# Patient Record
Sex: Male | Born: 1960
Health system: Southern US, Community
[De-identification: ages and names within clinical notes are randomized; demographics above are authoritative.]

## PROBLEM LIST (undated history)

## (undated) DIAGNOSIS — M2550 Pain in unspecified joint: Secondary | ICD-10-CM

## (undated) DIAGNOSIS — C801 Malignant (primary) neoplasm, unspecified: Secondary | ICD-10-CM

## (undated) DIAGNOSIS — N200 Calculus of kidney: Secondary | ICD-10-CM

## (undated) DIAGNOSIS — E079 Disorder of thyroid, unspecified: Secondary | ICD-10-CM

## (undated) DIAGNOSIS — M722 Plantar fascial fibromatosis: Secondary | ICD-10-CM

## (undated) DIAGNOSIS — D649 Anemia, unspecified: Secondary | ICD-10-CM

## (undated) HISTORY — DX: Anemia, unspecified: D64.9

## (undated) HISTORY — DX: Disorder of thyroid, unspecified: E07.9

## (undated) HISTORY — DX: Calculus of kidney: N20.0

## (undated) HISTORY — PX: HERNIA REPAIR: SHX51

## (undated) HISTORY — PX: LITHOTRIPSY: SUR834

## (undated) HISTORY — DX: Pain in unspecified joint: M25.50

## (undated) HISTORY — DX: Malignant (primary) neoplasm, unspecified: C80.1

## (undated) HISTORY — PX: COLONOSCOPY: SHX174

## (undated) HISTORY — DX: Plantar fascial fibromatosis: M72.2

---

## 2007-07-15 ENCOUNTER — Ambulatory Visit: Payer: Self-pay | Admitting: Sports Medicine

## 2007-07-15 DIAGNOSIS — M7750 Other enthesopathy of unspecified foot: Secondary | ICD-10-CM | POA: Insufficient documentation

## 2007-07-15 DIAGNOSIS — M549 Dorsalgia, unspecified: Secondary | ICD-10-CM | POA: Insufficient documentation

## 2007-11-01 ENCOUNTER — Ambulatory Visit: Payer: Self-pay | Admitting: Sports Medicine

## 2008-10-18 ENCOUNTER — Ambulatory Visit: Payer: Self-pay | Admitting: Sports Medicine

## 2008-10-18 DIAGNOSIS — M79609 Pain in unspecified limb: Secondary | ICD-10-CM

## 2011-06-06 DIAGNOSIS — N200 Calculus of kidney: Secondary | ICD-10-CM | POA: Insufficient documentation

## 2012-05-16 ENCOUNTER — Ambulatory Visit (INDEPENDENT_AMBULATORY_CARE_PROVIDER_SITE_OTHER): Payer: 59 | Admitting: Sports Medicine

## 2012-05-16 ENCOUNTER — Encounter: Payer: Self-pay | Admitting: Sports Medicine

## 2012-05-16 VITALS — BP 135/84 | HR 60 | Ht 71.0 in | Wt 185.0 lb

## 2012-05-16 DIAGNOSIS — D649 Anemia, unspecified: Secondary | ICD-10-CM

## 2012-05-16 DIAGNOSIS — M722 Plantar fascial fibromatosis: Secondary | ICD-10-CM

## 2012-05-16 NOTE — Assessment & Plan Note (Signed)
I suspect this relates to his leg cramping on left and his poorer biking performance Start on Ferrous sulfate  Follow up with Dr Nicholaus Bloom to be sure of etiology

## 2012-05-16 NOTE — Progress Notes (Signed)
  Subjective:    Patient ID: Benjamin Zimmerman, male    DOB: 1960-10-21, 51 y.o.   MRN: [id number]  HPI  Pt presents to clinic for f/u of heel pain L > R that he has had intermittently x 22 yrs. Also has left calf cramping and tightness worse in the mornings.  Does not notice swelling recently. Has tried Hapad insoles, and custom orthotics.  Increased standing causes pain. Rides bike most mornings - which helps loosen calf tightness and heel pain.  Firm shoes with narrow heel are most comfortable.   Has developed anemia - hgb down to a level of 12.3. Has donated blood regularly for many years. Feels more fatigued while biking - not able to work hills as hard    Review of Systems     Objective:   Physical Exam  NAD AT and foot structure normal bilat Nodule on midportion of lt ant calcaneus- feels like soft tissue at insertion of PF Arch mildly flattened- not significantly pronated Posterior tib functions well  Moderate TTP left PF insertion RT PF insertion is non tender Walking gati normal  Ultrasound Left PF is 0.75 with thickening in the proximal 2 to 3 cms of fascia Small spur noted Some hypoechoic change  Rt PF is 0.38 and appears normal Old spur appears to have separated from RT PF     Assessment & Plan:

## 2012-05-16 NOTE — Assessment & Plan Note (Addendum)
Note scan is worse than in 2010  Begin scaphoid pads Use arch straps PF stretches and exercises Icing Recheck in 8 wks  Add scaphoid pads to bike shoes

## 2012-05-16 NOTE — Patient Instructions (Addendum)
Try scaphoid pad in shoes when possible  Try ankle sleeve  Ice baths are very helpful for plantar fasciitis  Please start ferrous sulfate 325 mg once daily  Please follow up in 2 months  Thank you for seeing [place] today!

## 2012-06-03 ENCOUNTER — Encounter: Payer: Self-pay | Admitting: *Deleted

## 2012-07-19 ENCOUNTER — Ambulatory Visit (INDEPENDENT_AMBULATORY_CARE_PROVIDER_SITE_OTHER): Payer: 59 | Admitting: Sports Medicine

## 2012-07-19 ENCOUNTER — Encounter: Payer: Self-pay | Admitting: Sports Medicine

## 2012-07-19 VITALS — BP 128/87 | HR 69 | Ht 71.0 in | Wt 185.0 lb

## 2012-07-19 DIAGNOSIS — M722 Plantar fascial fibromatosis: Secondary | ICD-10-CM

## 2012-07-19 DIAGNOSIS — M79609 Pain in unspecified limb: Secondary | ICD-10-CM

## 2012-07-19 DIAGNOSIS — D649 Anemia, unspecified: Secondary | ICD-10-CM

## 2012-07-19 MED ORDER — AMITRIPTYLINE HCL 10 MG PO TABS: ORAL_TABLET | ORAL | Status: DC

## 2012-07-19 NOTE — Assessment & Plan Note (Signed)
This is improving with the series of stretches and exercises  He continues to use scaphoid pads and some padding to protect his feet  These have been helpful  I wanted him to continue the standard stretches and exercises for his plantar fasciitis for 2 more months and we can reevaluate

## 2012-07-19 NOTE — Progress Notes (Signed)
Patient ID: Benjamin Zimmerman, male   DOB: [birth year], 52 y.o.   MRN: [id number]  Patient returns for followup of plantar fasciitis in his left heel This was confirmed by ultrasound He does have a small spur He also had some changes over the metatarsal area  This has improved so he is having less pain with biking and activity  A second problem is cramping in the left foot and calf This affects the lateral left calf A body helix compression sleeve has been helpful  We found he had anemia and started him on iron He also notes some improvement in his calf cramps since starting the iron Last hemoglobin had improved to about 13 His colonoscopy and other workup was negative   Physical examination  Palpation of the left foot reveals no tenderness over the insertion to the plantar fascia There is a small nodule near the os calcis it feels like scar tissue He has good great toe motion He has a moderate arch  Examination of the calf does not reveal any tenderness There is no sign of discoloration or swelling

## 2012-07-19 NOTE — Assessment & Plan Note (Signed)
I am not sure if the cramps in his feet that go up into his left calf her triggered by some nerve irritation but I am suspicious of this  We will give him Toprol of amitriptyline 10-20 mg at nighttime  See if this helps after try that for one to 2 months

## 2012-07-19 NOTE — Assessment & Plan Note (Signed)
Keep up the iron at least once daily  We'll need to see if this improves his lower extremity symptoms which would suggest that his problem was more like restless leg

## 2014-06-11 ENCOUNTER — Other Ambulatory Visit: Payer: Self-pay | Admitting: Family Medicine

## 2014-06-11 DIAGNOSIS — E010 Iodine-deficiency related diffuse (endemic) goiter: Secondary | ICD-10-CM

## 2014-06-13 ENCOUNTER — Ambulatory Visit
Admission: RE | Admit: 2014-06-13 | Discharge: 2014-06-13 | Disposition: A | Discharge status: Home / Self Care | Payer: Managed Care, Other (non HMO) | Source: Ambulatory Visit | Attending: Family Medicine | Admitting: Family Medicine

## 2014-06-13 DIAGNOSIS — E010 Iodine-deficiency related diffuse (endemic) goiter: Secondary | ICD-10-CM

## 2014-06-19 ENCOUNTER — Other Ambulatory Visit: Payer: Self-pay | Admitting: Family Medicine

## 2014-06-19 DIAGNOSIS — E041 Nontoxic single thyroid nodule: Secondary | ICD-10-CM

## 2014-06-21 ENCOUNTER — Ambulatory Visit
Admission: RE | Admit: 2014-06-21 | Discharge: 2014-06-21 | Disposition: A | Discharge status: Home / Self Care | Payer: Managed Care, Other (non HMO) | Source: Ambulatory Visit | Attending: Family Medicine | Admitting: Family Medicine

## 2014-06-21 ENCOUNTER — Other Ambulatory Visit (HOSPITAL_COMMUNITY)
Admission: RE | Admit: 2014-06-21 | Discharge: 2014-06-21 | Disposition: A | Discharge status: Home / Self Care | Payer: Managed Care, Other (non HMO) | Source: Ambulatory Visit | Attending: Interventional Radiology | Admitting: Interventional Radiology

## 2014-06-21 DIAGNOSIS — E041 Nontoxic single thyroid nodule: Secondary | ICD-10-CM | POA: Insufficient documentation

## 2014-08-15 DIAGNOSIS — E890 Postprocedural hypothyroidism: Secondary | ICD-10-CM | POA: Insufficient documentation

## 2014-08-15 DIAGNOSIS — C73 Malignant neoplasm of thyroid gland: Secondary | ICD-10-CM | POA: Insufficient documentation

## 2015-02-12 DIAGNOSIS — H5210 Myopia, unspecified eye: Secondary | ICD-10-CM | POA: Insufficient documentation

## 2015-02-12 DIAGNOSIS — H43399 Other vitreous opacities, unspecified eye: Secondary | ICD-10-CM | POA: Insufficient documentation

## 2015-02-12 DIAGNOSIS — E079 Disorder of thyroid, unspecified: Secondary | ICD-10-CM | POA: Insufficient documentation

## 2015-04-09 ENCOUNTER — Encounter: Payer: Self-pay | Admitting: Family Medicine

## 2015-04-09 ENCOUNTER — Ambulatory Visit (INDEPENDENT_AMBULATORY_CARE_PROVIDER_SITE_OTHER): Payer: Managed Care, Other (non HMO) | Admitting: Family Medicine

## 2015-04-09 ENCOUNTER — Encounter (INDEPENDENT_AMBULATORY_CARE_PROVIDER_SITE_OTHER): Payer: Self-pay

## 2015-04-09 VITALS — BP 134/93 | HR 71 | Ht 70.0 in | Wt 185.0 lb

## 2015-04-09 DIAGNOSIS — M5010 Cervical disc disorder with radiculopathy, unspecified cervical region: Secondary | ICD-10-CM | POA: Diagnosis not present

## 2015-04-09 MED ORDER — PREDNISONE 10 MG PO TABS: ORAL_TABLET | ORAL | Status: DC

## 2015-04-09 NOTE — Patient Instructions (Signed)
You have cervical radiculopathy (a pinched nerve in the neck). Prednisone 6 day dose pack to relieve irritation/inflammation of the nerve. Consider cervical collar as often as possible to rest the nerve. Do home exercises daily. Consider physical therapy for stretching, exercises, traction, and modalities in the future - call me if you want to do this. Heat 15 minutes at a time 3-4 times a day to help with spasms. Watch head position when on computers, texting, when sleeping in bed - should in line with back to prevent further nerve traction and irritation. Consider home traction unit. If not improving we will consider an MRI. Follow up with me in 1 month to 6 weeks.

## 2015-04-10 DIAGNOSIS — M5010 Cervical disc disorder with radiculopathy, unspecified cervical region: Secondary | ICD-10-CM | POA: Insufficient documentation

## 2015-04-10 NOTE — Assessment & Plan Note (Signed)
mild, only with decreased sensation and reflexes.  Start with prednisone dose pack, cervical collar.  Continue with his home exercise program.  Heat as needed.  Ergonomic issues discussed.  F/u in 1 month to 6 weeks.  Consider MRI, physical therapy if not improving.

## 2015-04-10 NOTE — Progress Notes (Signed)
PCP: Nilda Simmer, MD  Subjective:   HPI: Patient is a 54 y.o. male here for left hand numbness.  Patient reports for 1 month he's had numbness in left middle, index fingers and thumb. No tingling. Gets these symptoms with motions of his neck. Pain level 0/10. Remotely had problems with neck, did home exercises and this resolved - backed off these though. No skin changes, fever, other complaints.  No past medical history on file.  Current Outpatient Prescriptions on File Prior to Visit  Medication Sig Dispense Refill  . amitriptyline (ELAVIL) 10 MG tablet 1 or 2 tablets daily at bedtime 60 tablet 2  . cyclobenzaprine (FLEXERIL) 10 MG tablet 0.5-1 by mouth at bedtime as needed for muscle spasm.      No current facility-administered medications on file prior to visit.    No past surgical history on file.  Allergies  Allergen Reactions  . Keflex [Cephalexin]     Social History   Social History  . Marital Status: Married    Spouse Name: N/A  . Number of Children: N/A  . Years of Education: N/A   Occupational History  . Not on file.   Social History Main Topics  . Smoking status: Never Smoker   . Smokeless tobacco: Not on file  . Alcohol Use: Not on file  . Drug Use: Not on file  . Sexual Activity: Not on file   Other Topics Concern  . Not on file   Social History Narrative    No family history on file.  BP 134/93 mmHg  Pulse 71  Ht 5\' 10"  (1.778 m)  Wt 185 lb (83.915 kg)  BMI 26.54 kg/m2  Review of Systems: See HPI above.    Objective:  Physical Exam:  Gen: NAD  Neck: No gross deformity, swelling, bruising. No TTP .  No midline/bony TTP. FROM neck - no pain. BUE strength 5/5.   Sensation intact to light touch.   Trace right triceps, brachioradialis MSRs.  2+ equal reflexes in bilateral biceps, left triceps, brachioradialis tendons. Negative spurlings.  Left hand: FROM all digits without pain. Negative tinels, phalens. Mild decreased  sensation index finger currently.    Assessment & Plan:  1. Cervical radiculopathy - mild, only with decreased sensation and reflexes.  Start with prednisone dose pack, cervical collar.  Continue with his home exercise program.  Heat as needed.  Ergonomic issues discussed.  F/u in 1 month to 6 weeks.  Consider MRI, physical therapy if not improving.

## 2015-04-12 ENCOUNTER — Telehealth: Payer: Self-pay | Admitting: Family Medicine

## 2015-04-12 NOTE — Telephone Encounter (Signed)
You could provide him with the cervical strain handout (same exercises for this as for a pinched nerve in the neck).  Other option would be physical therapy if he wanted to do this.

## 2015-04-12 NOTE — Telephone Encounter (Signed)
Spoke to patient. Sending exercise handout.

## 2015-09-10 ENCOUNTER — Encounter: Payer: Self-pay | Admitting: Sports Medicine

## 2015-09-10 ENCOUNTER — Ambulatory Visit (INDEPENDENT_AMBULATORY_CARE_PROVIDER_SITE_OTHER): Payer: Managed Care, Other (non HMO) | Admitting: Sports Medicine

## 2015-09-10 ENCOUNTER — Ambulatory Visit (INDEPENDENT_AMBULATORY_CARE_PROVIDER_SITE_OTHER): Payer: Managed Care, Other (non HMO)

## 2015-09-10 VITALS — BP 125/83 | HR 70 | Resp 18 | Wt 193.8 lb

## 2015-09-10 DIAGNOSIS — M5010 Cervical disc disorder with radiculopathy, unspecified cervical region: Secondary | ICD-10-CM

## 2015-09-10 DIAGNOSIS — M722 Plantar fascial fibromatosis: Secondary | ICD-10-CM | POA: Diagnosis not present

## 2015-09-10 DIAGNOSIS — M545 Low back pain: Secondary | ICD-10-CM

## 2015-09-10 DIAGNOSIS — M5416 Radiculopathy, lumbar region: Secondary | ICD-10-CM

## 2015-09-10 DIAGNOSIS — M2560 Stiffness of unspecified joint, not elsewhere classified: Secondary | ICD-10-CM | POA: Diagnosis not present

## 2015-09-10 DIAGNOSIS — M792 Neuralgia and neuritis, unspecified: Secondary | ICD-10-CM | POA: Insufficient documentation

## 2015-09-10 MED ORDER — MELOXICAM 15 MG PO TABS: ORAL_TABLET | ORAL | Status: DC

## 2015-09-10 MED ORDER — PREDNISONE 50 MG PO TABS: ORAL_TABLET | ORAL | Status: DC

## 2015-09-10 NOTE — Assessment & Plan Note (Signed)
Checking standard rheumatoid workup

## 2015-09-10 NOTE — Assessment & Plan Note (Signed)
Bilateral left worse than right plantar fasciitis. His failed several steroid injections, physical therapy. He will return for custom orthotics, if insufficient response we will proceed with PRP injection.

## 2015-09-10 NOTE — Assessment & Plan Note (Signed)
Classic left C6 and C7 radiculopathy. Aggressive formal physical therapy, prednisone burst for 5 days, meloxicam, baseline x-rays of the C-spine. If insufficient response in one month we will proceed with MRI for interventional planning.

## 2015-09-10 NOTE — Assessment & Plan Note (Signed)
Left L5 radiculopathy. As above, prednisone, physical therapy, baseline x-rays, meloxicam. MRI for interventional planning if no better.

## 2015-09-10 NOTE — Progress Notes (Signed)
   Subjective:    I'm seeing this patient as a consultation for:  Dr. Kathyrn Lass  CC: Multiple issues  HPI: Plantar fasciitis: Left-sided, but bilateral, he's failed several steroid injections, physical therapy, he has only had rigid custom orthotics, has never had cushioned orthotics. Has never done PRP.  Left lumbar radiculitis: Radiation to the foot in an L5 distribution, has not yet had any treatment.  Left cervical radiculopathy: Left C6 and C7, moderate, persistent.  Past medical history, Surgical history, Family history not pertinant except as noted below, Social history, Allergies, and medications have been entered into the medical record, reviewed, and no changes needed.   Review of Systems: No headache, visual changes, nausea, vomiting, diarrhea, constipation, dizziness, abdominal pain, skin rash, fevers, chills, night sweats, weight loss, swollen lymph nodes, body aches, joint swelling, muscle aches, chest pain, shortness of breath, mood changes, visual or auditory hallucinations.   Objective:   General: Well Developed, well nourished, and in no acute distress.  Neuro/Psych: Alert and oriented x3, extra-ocular muscles intact, able to move all 4 extremities, sensation grossly intact. Skin: Warm and dry, no rashes noted.  Respiratory: Not using accessory muscles, speaking in full sentences, trachea midline.  Cardiovascular: Pulses palpable, no extremity edema. Abdomen: Does not appear distended. Neck: Negative spurling's Full neck range of motion Grip strength and sensation normal in bilateral hands Strength good C4 to T1 distribution No sensory change to C4 to T1 Reflexes normal Back Exam:  Inspection: Unremarkable  Motion: Flexion 45 deg, Extension 45 deg, Side Bending to 45 deg bilaterally,  Rotation to 45 deg bilaterally  SLR laying: Negative  XSLR laying: Negative  Palpable tenderness: None. FABER: negative. Sensory change: Gross sensation intact to all lumbar  and sacral dermatomes.  Reflexes: 2+ at both patellar tendons, 2+ at achilles tendons, Babinski's downgoing.  Strength at foot  Plantar-flexion: 5/5 Dorsi-flexion: 5/5 Eversion: 5/5 Inversion: 5/5  Leg strength  Quad: 5/5 Hamstring: 5/5 Hip flexor: 5/5 Hip abductors: 5/5  Gait unremarkable.  Impression and Recommendations:   This case required medical decision making of moderate complexity.

## 2015-09-27 ENCOUNTER — Ambulatory Visit (INDEPENDENT_AMBULATORY_CARE_PROVIDER_SITE_OTHER): Payer: Managed Care, Other (non HMO) | Admitting: Sports Medicine

## 2015-09-27 ENCOUNTER — Encounter: Payer: Self-pay | Admitting: Sports Medicine

## 2015-09-27 VITALS — BP 116/71 | HR 64 | Resp 18 | Wt 192.3 lb

## 2015-09-27 DIAGNOSIS — M609 Myositis, unspecified: Secondary | ICD-10-CM

## 2015-09-27 DIAGNOSIS — IMO0001 Reserved for inherently not codable concepts without codable children: Secondary | ICD-10-CM

## 2015-09-27 DIAGNOSIS — M791 Myalgia: Secondary | ICD-10-CM

## 2015-09-27 DIAGNOSIS — M722 Plantar fascial fibromatosis: Secondary | ICD-10-CM | POA: Diagnosis not present

## 2015-09-27 MED ORDER — CYCLOBENZAPRINE HCL 10 MG PO TABS: ORAL_TABLET | ORAL | Status: DC

## 2015-09-27 NOTE — Progress Notes (Signed)

## 2015-09-27 NOTE — Assessment & Plan Note (Signed)
Continued bilateral left worse than right plantar fasciitis,  Failed several steroid injections, custom orthotics today, if insufficient response we will proceed with PRP.

## 2015-09-28 LAB — COMPREHENSIVE METABOLIC PANEL
ALT: 20 U/L (ref 9–46)
Alkaline Phosphatase: 69 U/L (ref 40–115)
BUN: 18 mg/dL (ref 7–25)
CO2: 23 mmol/L (ref 20–31)
Calcium: 9.4 mg/dL (ref 8.6–10.3)
Glucose, Bld: 89 mg/dL (ref 65–99)
Potassium: 4.4 mmol/L (ref 3.5–5.3)

## 2015-09-28 LAB — COMPREHENSIVE METABOLIC PANEL WITH GFR
AST: 20 U/L (ref 10–35)
Albumin: 4.4 g/dL (ref 3.6–5.1)
Chloride: 103 mmol/L (ref 98–110)
Creat: 0.91 mg/dL (ref 0.70–1.33)
Sodium: 139 mmol/L (ref 135–146)
Total Bilirubin: 0.6 mg/dL (ref 0.2–1.2)
Total Protein: 6.7 g/dL (ref 6.1–8.1)

## 2015-09-28 LAB — CBC
HCT: 46.6 % (ref 38.5–50.0)
Hemoglobin: 15.1 g/dL (ref 13.2–17.1)
MCH: 28.9 pg (ref 27.0–33.0)
MCHC: 32.4 g/dL (ref 32.0–36.0)
MCV: 89.1 fL (ref 80.0–100.0)
MPV: 10.1 fL (ref 7.5–12.5)
Platelets: 310 10*3/uL (ref 140–400)
RBC: 5.23 MIL/uL (ref 4.20–5.80)
RDW: 13.2 % (ref 11.0–15.0)
WBC: 4.2 10*3/uL (ref 3.8–10.8)

## 2015-09-28 LAB — CK: Total CK: 77 U/L (ref 7–232)

## 2015-09-28 LAB — URIC ACID: Uric Acid, Serum: 5 mg/dL (ref 4.0–7.8)

## 2015-09-28 LAB — SEDIMENTATION RATE: Sed Rate: 1 mm/hr (ref 0–20)

## 2015-09-28 LAB — RHEUMATOID FACTOR: Rheumatoid fact SerPl-aCnc: <10 [IU]/mL (ref <=14)

## 2015-09-30 LAB — ANA: Anti Nuclear Antibody(ANA): NEGATIVE

## 2015-09-30 LAB — CYCLIC CITRUL PEPTIDE ANTIBODY, IGG: Cyclic Citrullin Peptide Ab: <16 Units

## 2016-02-27 ENCOUNTER — Ambulatory Visit (INDEPENDENT_AMBULATORY_CARE_PROVIDER_SITE_OTHER): Payer: Managed Care, Other (non HMO) | Admitting: Sports Medicine

## 2016-02-27 ENCOUNTER — Encounter: Payer: Self-pay | Admitting: Sports Medicine

## 2016-02-27 DIAGNOSIS — F418 Other specified anxiety disorders: Secondary | ICD-10-CM

## 2016-02-27 DIAGNOSIS — M5416 Radiculopathy, lumbar region: Secondary | ICD-10-CM | POA: Diagnosis not present

## 2016-02-27 DIAGNOSIS — M5010 Cervical disc disorder with radiculopathy, unspecified cervical region: Secondary | ICD-10-CM | POA: Diagnosis not present

## 2016-02-27 DIAGNOSIS — F329 Major depressive disorder, single episode, unspecified: Secondary | ICD-10-CM | POA: Insufficient documentation

## 2016-02-27 DIAGNOSIS — F32A Depression, unspecified: Secondary | ICD-10-CM | POA: Insufficient documentation

## 2016-02-27 DIAGNOSIS — F419 Anxiety disorder, unspecified: Secondary | ICD-10-CM

## 2016-02-27 NOTE — Assessment & Plan Note (Addendum)
Patient needs to touch base with his PCP regarding this. His pain will never be fully controlled unless his depression and anxiety is under control. He really has mild symptoms, sleep seems to be the major issue.

## 2016-02-27 NOTE — Assessment & Plan Note (Signed)
Left lumbar radiculopathy, patient is somewhat hesitant to accept this diagnosis, referral to [facility].

## 2016-02-27 NOTE — Progress Notes (Signed)
  Subjective:    CC: Follow-up  HPI: Left cervical radiculopathy: Desires referral to the spine Center at [facility] for further evaluation.  Left lumbar radiculopathy: Is not entirely convinced that the cramping running down his leg can be coming from his back, does have a bit of back pain. X-rays showed multilevel degenerative changes, he does desire that we refer him to [facility] for further evaluation and treatment.  Mood disorder: Noted difficulty sleeping, some Restoril and felt better. Overall endorses mild fatigue, and mild nervousness, difficulty controlling his worry, worrying about different things, and trouble relaxing. No suicidal or homicidal ideation.  Past medical history:  Negative.  See flowsheet/record as well for more information.  Surgical history: Negative.  See flowsheet/record as well for more information.  Family history: Negative.  See flowsheet/record as well for more information.  Social history: Negative.  See flowsheet/record as well for more information.  Allergies, and medications have been entered into the medical record, reviewed, and no changes needed.   Review of Systems: No fevers, chills, night sweats, weight loss, chest pain, or shortness of breath.   Objective:    General: Well Developed, well nourished, and in no acute distress.  Neuro: Alert and oriented x3, extra-ocular muscles intact, sensation grossly intact.  HEENT: Normocephalic, atraumatic, pupils equal round reactive to light, neck supple, no masses, no lymphadenopathy, thyroid nonpalpable.  Skin: Warm and dry, no rashes. Cardiac: Regular rate and rhythm, no murmurs rubs or gallops, no lower extremity edema.  Respiratory: Clear to auscultation bilaterally. Not using accessory muscles, speaking in full sentences.  Impression and Recommendations:    Cervical disc disorder with radiculopathy of cervical region Referral to [facility] per patient request. He has left cervical radiculopathy with  evidence of degenerative disc disease at multiple levels on x-ray, he ultimately is going to need an MRI and likely epidurals.  Left lumbar radiculopathy Left lumbar radiculopathy, patient is somewhat hesitant to accept this diagnosis, referral to [facility].  Anxiety and depression Patient needs to touch base with his PCP regarding this. His pain will never be fully controlled unless his depression and anxiety is under control.

## 2016-02-27 NOTE — Assessment & Plan Note (Signed)
Referral to [facility] per patient request. He has left cervical radiculopathy with evidence of degenerative disc disease at multiple levels on x-ray, he ultimately is going to need an MRI and likely epidurals.

## 2016-04-03 ENCOUNTER — Other Ambulatory Visit: Payer: Self-pay | Admitting: Sports Medicine

## 2016-04-03 DIAGNOSIS — M5010 Cervical disc disorder with radiculopathy, unspecified cervical region: Secondary | ICD-10-CM

## 2016-04-07 ENCOUNTER — Other Ambulatory Visit: Payer: Self-pay | Admitting: Neurosurgery

## 2016-04-07 DIAGNOSIS — M5416 Radiculopathy, lumbar region: Secondary | ICD-10-CM

## 2016-04-11 ENCOUNTER — Ambulatory Visit (HOSPITAL_BASED_OUTPATIENT_CLINIC_OR_DEPARTMENT_OTHER): Admission: RE | Admit: 2016-04-11 | Payer: Managed Care, Other (non HMO) | Source: Ambulatory Visit

## 2016-04-30 ENCOUNTER — Ambulatory Visit (HOSPITAL_BASED_OUTPATIENT_CLINIC_OR_DEPARTMENT_OTHER)
Admission: RE | Admit: 2016-04-30 | Discharge: 2016-04-30 | Disposition: A | Discharge status: Home / Self Care | Payer: Managed Care, Other (non HMO) | Source: Ambulatory Visit | Attending: Neurosurgery | Admitting: Neurosurgery

## 2016-04-30 DIAGNOSIS — M5116 Intervertebral disc disorders with radiculopathy, lumbar region: Secondary | ICD-10-CM | POA: Insufficient documentation

## 2016-04-30 DIAGNOSIS — Q057 Lumbar spina bifida without hydrocephalus: Secondary | ICD-10-CM | POA: Diagnosis not present

## 2016-04-30 DIAGNOSIS — M5416 Radiculopathy, lumbar region: Secondary | ICD-10-CM | POA: Diagnosis present

## 2016-11-19 ENCOUNTER — Encounter: Payer: Self-pay | Admitting: Sports Medicine

## 2016-11-19 ENCOUNTER — Ambulatory Visit (INDEPENDENT_AMBULATORY_CARE_PROVIDER_SITE_OTHER): Payer: Managed Care, Other (non HMO) | Admitting: Sports Medicine

## 2016-11-19 DIAGNOSIS — G5792 Unspecified mononeuropathy of left lower limb: Secondary | ICD-10-CM

## 2016-11-19 DIAGNOSIS — M722 Plantar fascial fibromatosis: Secondary | ICD-10-CM | POA: Diagnosis not present

## 2016-11-19 DIAGNOSIS — M792 Neuralgia and neuritis, unspecified: Secondary | ICD-10-CM

## 2016-11-19 MED ORDER — GABAPENTIN 300 MG PO CAPS: 300.0000 mg | ORAL_CAPSULE | Freq: Every day | ORAL | 2 refills | Status: DC

## 2016-11-19 NOTE — Assessment & Plan Note (Signed)
While this does not appear to be lumbar mediated neuropathic pain I believe the pattern is likely nerve compression. Since evaluations are so unrevealing I suspect this is a branch of sural nerve  Trial on 300 gabapentin x 1 mo and keep sx diary Keep up stretches and normal exercise pattern Titrate up if needed for gabapentin if we are sure he is getting some response (note did not like side-effects of amitriptyline in 2013)

## 2016-11-19 NOTE — Progress Notes (Signed)
CC: left leg and foot pain  Referred: courtesy of Dr Jacelyn Grip  HPI - I saw patient in 2013 for plantar fasciitis that improved with stretches and exercises. However, shortly after that more cramping of feet and toes Some perioidic cramping in the left leg These sxs worsen at night Running will make them worse  Pain is not classic for Pf but sometimes hurts first thing in morning  Had full evaluation to see if this was lumbar spine MRI is unremarkable  Past HX Cervical radiculopathy 2016 Dr Barbaraann Barthel Thyroid cancer 2016 Orthotics by Dr Burman Foster  2017  ROS No sciatica No cramping in the RT lower ext Calf tightness primarily Left but sometimes RT  PE Thin W M in NAD BP 120/70   Ht 5\' 10"  (1.778 m)   Wt 165 lb (74.8 kg)   BMI 23.68 kg/m   SLR is neg. Palpation of left leg from buttocks to knee reveals no TTP Also no pressure related tinels to sciatic pressure Palpation of calf reveals a couple of areas of very mild TTP No bruising No swelling  Left plantar fascia is only mildly TTP Good great toe motion No TTP over forefoot  Scanning Ultrasound Left lower Extremity The entire calf visualized No sign of any tears Some varicosities deep in soleus and in upper lateral gastroc No swelling noted  Plantar fascia is mildly enlarged at 0.63 cm but no rea swelling  Forefoot shows no neuroma Mild bony irregularity at plantar MT region at sessamoids and at MT 3 head  Impression:  Nothing remarkable to explain symptoms on ultrasound of left lower extremity. Mild thickening of left plantar fascia  Ultrasound and interpretation by Benjamin Zimmerman. Benjamin Alar, MD

## 2016-11-19 NOTE — Assessment & Plan Note (Signed)
Still some thickening but I doubt this is cause of sxs  Keep up orthotics  Keep up stretches

## 2016-12-22 ENCOUNTER — Ambulatory Visit (INDEPENDENT_AMBULATORY_CARE_PROVIDER_SITE_OTHER): Payer: Managed Care, Other (non HMO) | Admitting: Sports Medicine

## 2016-12-22 ENCOUNTER — Encounter: Payer: Self-pay | Admitting: Sports Medicine

## 2016-12-22 DIAGNOSIS — M792 Neuralgia and neuritis, unspecified: Secondary | ICD-10-CM

## 2016-12-22 DIAGNOSIS — G5792 Unspecified mononeuropathy of left lower limb: Secondary | ICD-10-CM

## 2016-12-22 MED ORDER — GABAPENTIN 300 MG PO CAPS: ORAL_CAPSULE | ORAL | 2 refills | Status: DC

## 2016-12-22 NOTE — Assessment & Plan Note (Signed)
Pain still seems neuropathic to me I don't think the dose of gabapentin had enough effiect  Increase to therapeutic dose  300 bid and 600 qhs See if higher dose will lessen sxs  Reck 1 mo

## 2016-12-22 NOTE — Progress Notes (Signed)
CC: Chronic left foot pain and cramping  Evaluated by DR T for probably radiculopathy Lumbar MRI showed some possible irritation but not clearly enough to explain sxs On last visit I went over exam No clear source of nerve entrapment However exam and history still most consistent with nerve mediated pain  Trial gabapentin 300 HS for 1 month Did not make him drowsy and sleep pattern still irregular Not much reduction in nocturnal cramps Note he is still on synrthroid 0.125 and wonders if this is keeping him awake  ROS No first moprning heel pain No numbness on foot No clear change with activity  PE WD WM in NAD BP 120/70   Ht 5\' 10"  (1.778 m)   Wt 165 lb (74.8 kg)   BMI 23.68 kg/m   No TTP at medial heel Neg Tinel at TT and lateral foot No MT TTP No swelling  SLR neg Back extension causes no real change in sxs

## 2016-12-22 NOTE — Patient Instructions (Signed)
I still think Nerve irritation is the likely cause I don't think gabapentin was strong enough for you  Days 1 to 5 gabapentin 600 at night Days 6 to 10 -- 300 in morning and 600 at night Days 11 to 15 -- 300 morning/ 300 noon/ 600 night  Stay on that  See me in 1 month  Neuropathic cramping/ radiculopathy

## 2017-01-21 ENCOUNTER — Ambulatory Visit (INDEPENDENT_AMBULATORY_CARE_PROVIDER_SITE_OTHER): Payer: Managed Care, Other (non HMO) | Admitting: Sports Medicine

## 2017-01-21 VITALS — BP 110/70 | Ht 70.0 in | Wt 165.0 lb

## 2017-01-21 DIAGNOSIS — G5792 Unspecified mononeuropathy of left lower limb: Secondary | ICD-10-CM | POA: Diagnosis not present

## 2017-01-21 DIAGNOSIS — M792 Neuralgia and neuritis, unspecified: Secondary | ICD-10-CM

## 2017-01-21 MED ORDER — GABAPENTIN 300 MG PO CAPS: ORAL_CAPSULE | ORAL | 3 refills | Status: DC

## 2017-01-21 NOTE — Progress Notes (Signed)
   Subjective:    Patient ID: Benjamin Zimmerman, male    DOB: 1960/06/09, 56 y.o.   MRN: [id number]  HPI 56 yo Man presenting for follow-up L foot pain and plantar fasciitis. He states the plantar fasciitis is unchanged. His L foot pain is worse today, however he did have some improvement on increased Gabapentin dose recently (300mg  - 300mg  - 600mg ) which resulted In 3-4 days over the past week without significant discomfort. He describes vague cramping discomfort in the 2-3rd toes and dorsum of foot. He denies activity changes, new injury, or worsening symptoms. His discomfort is improved with compression stalkings and tightly fitting heel cup in his shoes. No other palliative measures are endorsed.   Note - able to bike most days with no increase in pain  Review of Systems  Constitutional: Negative for chills and fever.  Respiratory: Negative for chest tightness and shortness of breath.   Cardiovascular: Negative for chest pain and leg swelling.  Musculoskeletal: Negative for gait problem and joint swelling.  Skin: Negative for pallor and rash.  Neurological: Negative for weakness and numbness.       Discomfort Dorsum of L Foot       Objective:   Physical Exam  Constitutional: He is oriented to person, place, and time. He appears well-developed and well-nourished. No distress.  HENT:  Head: Normocephalic and atraumatic.  Cardiovascular: Normal rate and intact distal pulses.   Pulmonary/Chest: Effort normal. No respiratory distress.  Musculoskeletal: Normal range of motion.  Lower Extremity: Symmetric bilateral hair loss distal lower extremity, no erythema or edema noted in feet Non-tender to palpation: Base of 5th MT b/l, Great Toe b/l, Med/Lat Malleoli b/l, Fibular Head b/l, 1-5th MT ROM: Plantar Flx, Dorsi Flx, Inversion, Eversion all nml Mm Strength: Inv/Eversion 5/5 b/l, EHL 5/5 b/l Neurovascularly intact in the distal lower extremity b/l Special Testing: Calc Squeeze neg b/l,  Talar Tilt neg b/l, Thompson neg b/l Hip:  Full ROM b/l Hip Scour neg b/l No tenderness to palpation laterally (Greater Troch)  Neurological: He is alert and oriented to person, place, and time.  Skin: Skin is warm. He is not diaphoretic. No erythema.  Psychiatric: He has a normal mood and affect.       Assessment & Plan:  Chronic Left Foot Pain and Cramping Left Plantar Fasciitis  Given improvement with Gabapentin feel his pain is likely Neuropathic in nature. We will plan to increase dose and titrate up over the next 3 weeks. Will progress week 1 (600mg  - 300 mg - 600 mg) week 2-3 (600mg  - 600mg  - 600mg ) week 4-6 (600mg  - 600mg  - 900mg ) and see him back in 6 weeks for follow up. If pain persists on this dose consider additional imaging to investigate possible compression, however today don't feel this is indicated given lack of new new findings on exam today and no definitive exam findings exhibiting compression neuropathy. Continue supportive measures for Left Plantar Fasciitis.

## 2017-01-21 NOTE — Assessment & Plan Note (Signed)
I believe we are seeing some response from higher doses of gabapentin  With this in mind we want to gradually titrate upward  Stop at dose of 600/ 600/ 900  Reck in about 6 weeks

## 2017-01-21 NOTE — Patient Instructions (Addendum)
Gabapentin schedule until our next visit: Week 1: 600 mg am, 300 mg lunch, 600 mg evening Week 2-3: 600 mg am, 600 mg lunch, 600 mg evening Week 4-6: 600 mg am, 600 mg lunch, 900 mg evening  Follow up in 6 weeks.

## 2017-03-04 ENCOUNTER — Ambulatory Visit: Payer: Managed Care, Other (non HMO) | Admitting: Sports Medicine

## 2017-03-04 ENCOUNTER — Ambulatory Visit (INDEPENDENT_AMBULATORY_CARE_PROVIDER_SITE_OTHER): Payer: Managed Care, Other (non HMO) | Admitting: Sports Medicine

## 2017-03-04 ENCOUNTER — Encounter: Payer: Self-pay | Admitting: Sports Medicine

## 2017-03-04 DIAGNOSIS — G5792 Unspecified mononeuropathy of left lower limb: Secondary | ICD-10-CM | POA: Diagnosis not present

## 2017-03-04 DIAGNOSIS — M792 Neuralgia and neuritis, unspecified: Secondary | ICD-10-CM

## 2017-03-04 MED ORDER — METHYLPREDNISOLONE ACETATE 40 MG/ML IJ SUSP
40.0000 mg | Freq: Once | INTRAMUSCULAR | Status: AC
  Administered 2017-03-04: 40 mg via INTRA_ARTICULAR

## 2017-03-04 NOTE — Progress Notes (Signed)
Neuropathic pain to left foot  Continues to have tightness and cramps into foot at night sometimes happens in day Maybe some relief with gabapentin but not much change since last visit Getting sharp tinel like pain with pressure around fibular head Massage to peroneals brought this out  Dec. 2017 MRI showed some nerve impingement likely at L3 area but not clear evidence For this being the source  ROS No weakness in left leg No numbness to foot No PF sxs today  PE WDWNWM BP 110/60   Ht 5\' 10"  (1.778 m)   Wt 165 lb (74.8 kg)   BMI 23.68 kg/m   Ankle: No visible erythema or swelling. Range of motion is full in all directions. Strength is 5/5 in all directions. Stable lateral and medial ligaments; squeeze test and kleiger test unremarkable; Talar dome nontender; No pain at base of 5th MT; No tenderness over cuboid; No tenderness over N spot or navicular prominence No tenderness on posterior aspects of lateral and medial malleolus No sign of peroneal tendon subluxations; Negative tarsal tunnel tinel's Able to walk 4 steps.  Foot - no TTP or swelling  Fibular head Feels tingling with pressure behind this  Ultrasound: common peroneal nerve  Behind fibular head left common peroneal nerve visualized These is a thick fascia above and surrounding nerve Nerve itself appears normal  Impression - possible fascial entrapment of common peroneal nerve  Ultrasound and interpretation by Wolfgang Phoenix. [place] Alar, MD

## 2017-03-04 NOTE — Assessment & Plan Note (Signed)
On [place] he has thick fascia around common peroneal n  Trial with hydrodissection  Procedure:  Injection of left common peroneal nerve sheath Consent obtained and verified. Time-out conducted. Noted no overlying erythema, induration, or other signs of local infection. Skin prepped in a sterile fashion. Topical analgesic spray: Ethyl chloride. Completed without difficulty.  We localized the proximal common peroneal nerve.  I inserted needle to edge of nerve and hydrodissected this area using a volume of 3 ccs. Meds: 1 cc slumedrol 40 and 2 cc lidocaine 1% Pain immediately improved suggesting accurate placement of the medication. Advised to call if fevers/chills, erythema, induration, drainage, or persistent bleeding.  We will give this 6 weeks to see if it helps  Switch gabapentin just to HS since that is his time of most sxs 900 to 1200  Reck 6 wks

## 2017-04-20 ENCOUNTER — Ambulatory Visit (INDEPENDENT_AMBULATORY_CARE_PROVIDER_SITE_OTHER): Payer: Managed Care, Other (non HMO) | Admitting: Sports Medicine

## 2017-04-20 ENCOUNTER — Encounter: Payer: Self-pay | Admitting: Sports Medicine

## 2017-04-20 DIAGNOSIS — M67442 Ganglion, left hand: Secondary | ICD-10-CM

## 2017-04-20 DIAGNOSIS — G5792 Unspecified mononeuropathy of left lower limb: Secondary | ICD-10-CM

## 2017-04-20 DIAGNOSIS — M792 Neuralgia and neuritis, unspecified: Secondary | ICD-10-CM

## 2017-04-20 MED ORDER — CLONAZEPAM 0.5 MG PO TABS: 0.5000 mg | ORAL_TABLET | Freq: Two times a day (BID) | ORAL | 2 refills | Status: DC

## 2017-04-20 NOTE — Assessment & Plan Note (Signed)
We will follow  If painful - [place] and drain

## 2017-04-20 NOTE — Patient Instructions (Signed)
Vitamin B6 50 mg or 100 mg tablets. 1 daily for right foot numbness. Remember some knee to chest stretches for low back Ankle balance exercises Will follow the cyst on left 3rd finger

## 2017-04-20 NOTE — Progress Notes (Signed)
Left foot pain - neuropathic  Injection of common peroneal nerve area with hydrodissection helped lower overall pain Gabapentin no real difference and stopped after 2 mos. Less severe pain in left foot now Cramps are less common Still primarily bothers him at night and still will wake him up at times  Cyst on left 3rd proximal phalanx of hand Not really painful but feels this  Some numbness RT heel/ no injury  ROS No numbness in left foot No swelling in foot Some periodic LB stiffness No true sciatica  PE Pleasant M in NAD BP 110/70   Ht 5\' 10"  (1.778 m)   Wt 165 lb (74.8 kg)   BMI 23.68 kg/m   Ankle: Left No visible erythema or swelling. Range of motion is full in all directions. Strength is 5/5 in all directions. Stable lateral and medial ligaments; squeeze test and kleiger test unremarkable; Talar dome nontender; No pain at base of 5th MT; No tenderness over cuboid; No tenderness over N spot or navicular prominence No tenderness on posterior aspects of lateral and medial malleolus No sign of peroneal tendon subluxations; Negative tarsal tunnel tinel's Able to walk 4 steps.  RT ankle foot exam normal Mild decrease in light sensation at RT peroneal area of heel  Moveable soft cystic structure flexor tendon of 3rd orox phalanx of left hand

## 2017-04-20 NOTE — Assessment & Plan Note (Signed)
We will try klonpin at night  Poor rest pattern and cramping may be helfpurl 0,5 mg one or two  1 foot balance/ std and reach exercises

## 2017-08-25 IMAGING — DX DG LUMBAR SPINE COMPLETE 4+V
5 series · 5 of 5 positions shown · non-contrast
Comparison: None.

CLINICAL DATA: Left side muscle spasms in stiffness for years. No
known injury.

EXAM:
LUMBAR SPINE - COMPLETE 4+ VIEW

[l-spine ap]
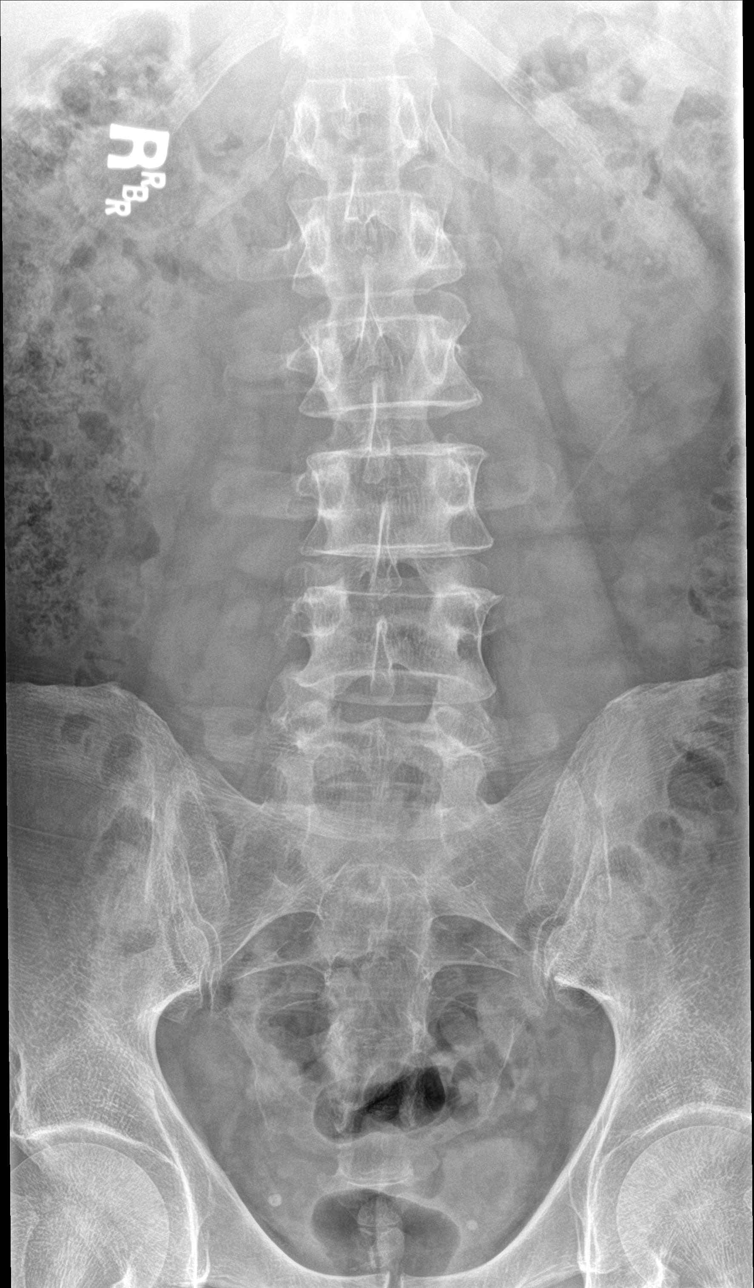

[l-spine obl (1 of 2)]
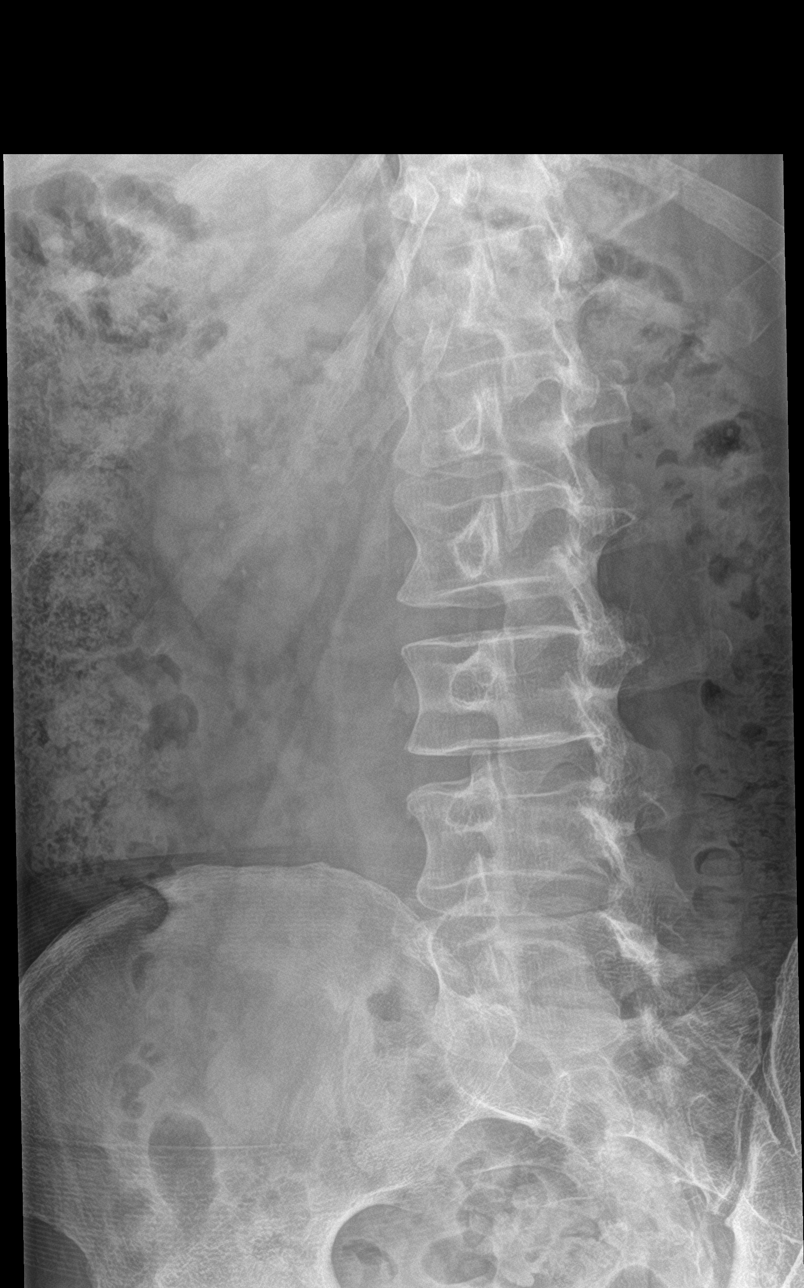

[l-spine obl (2 of 2)]
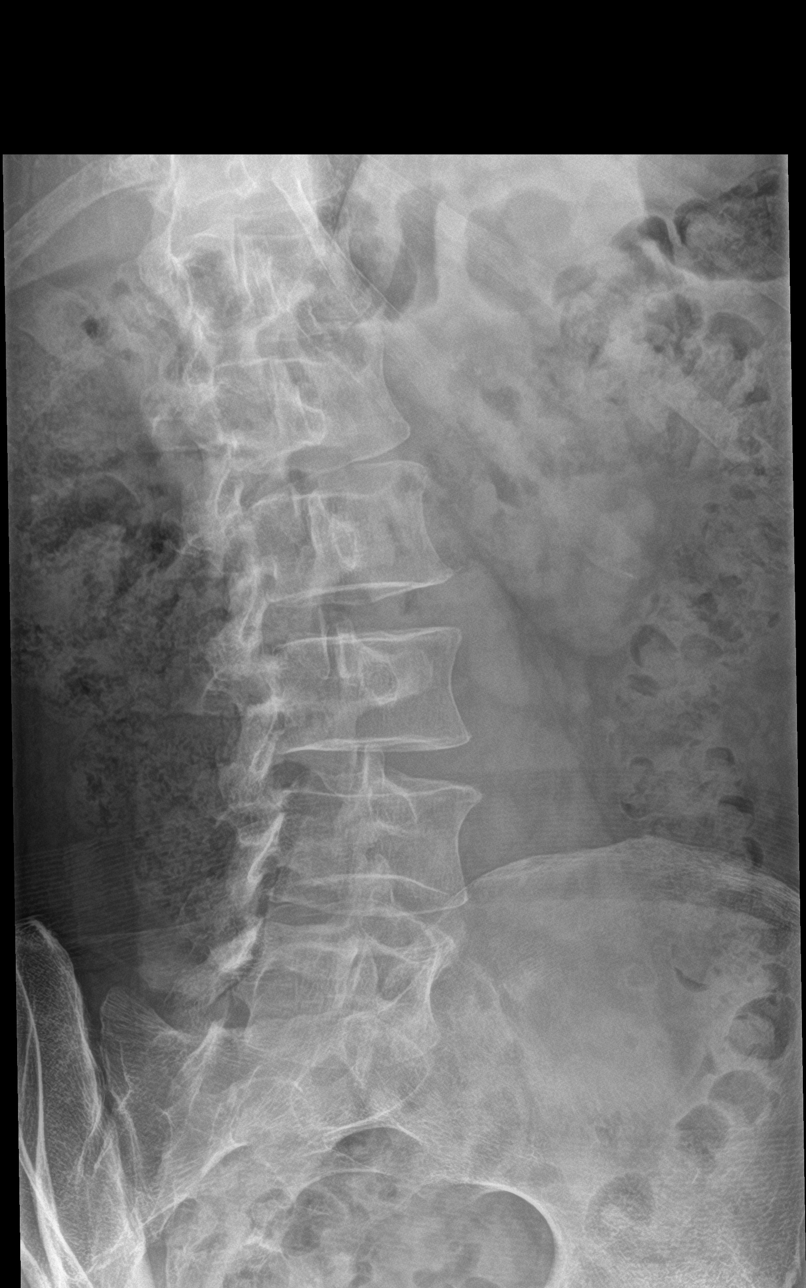

[l-spine lat]
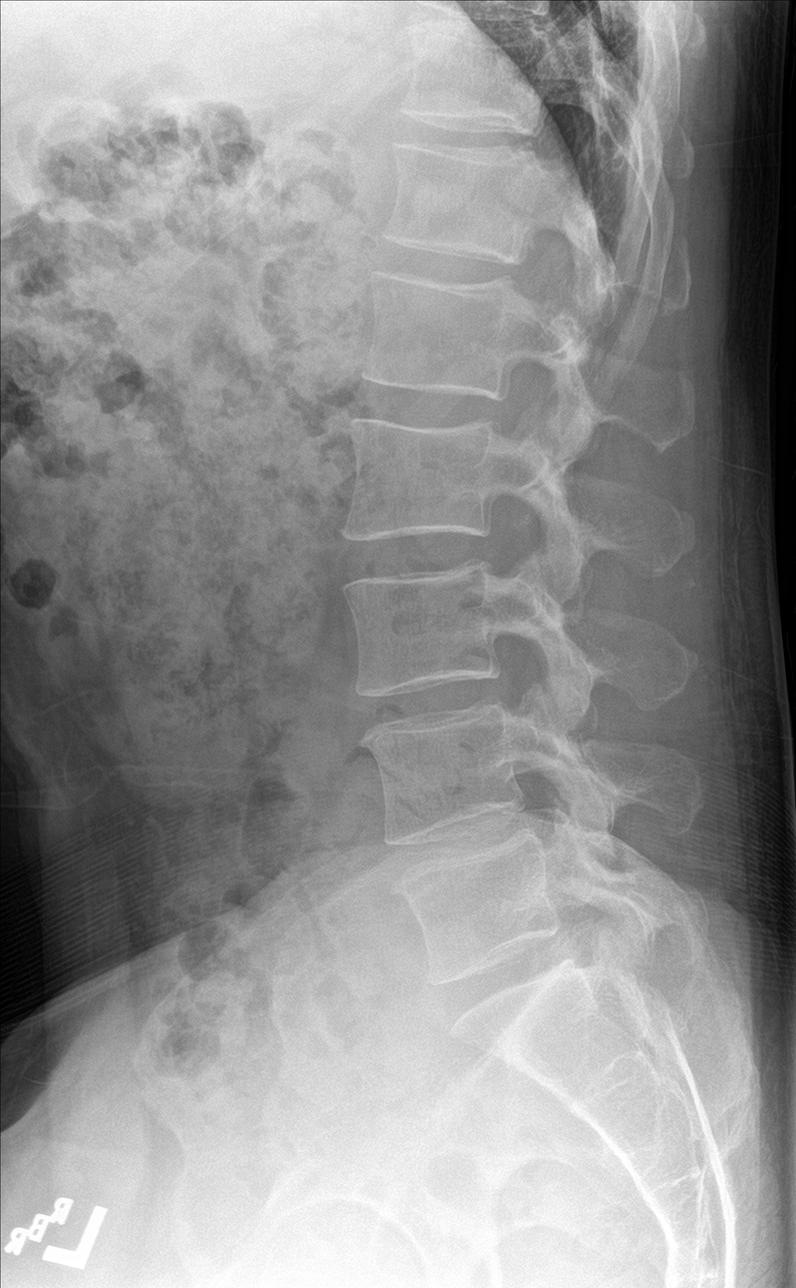

[l-spine spot]
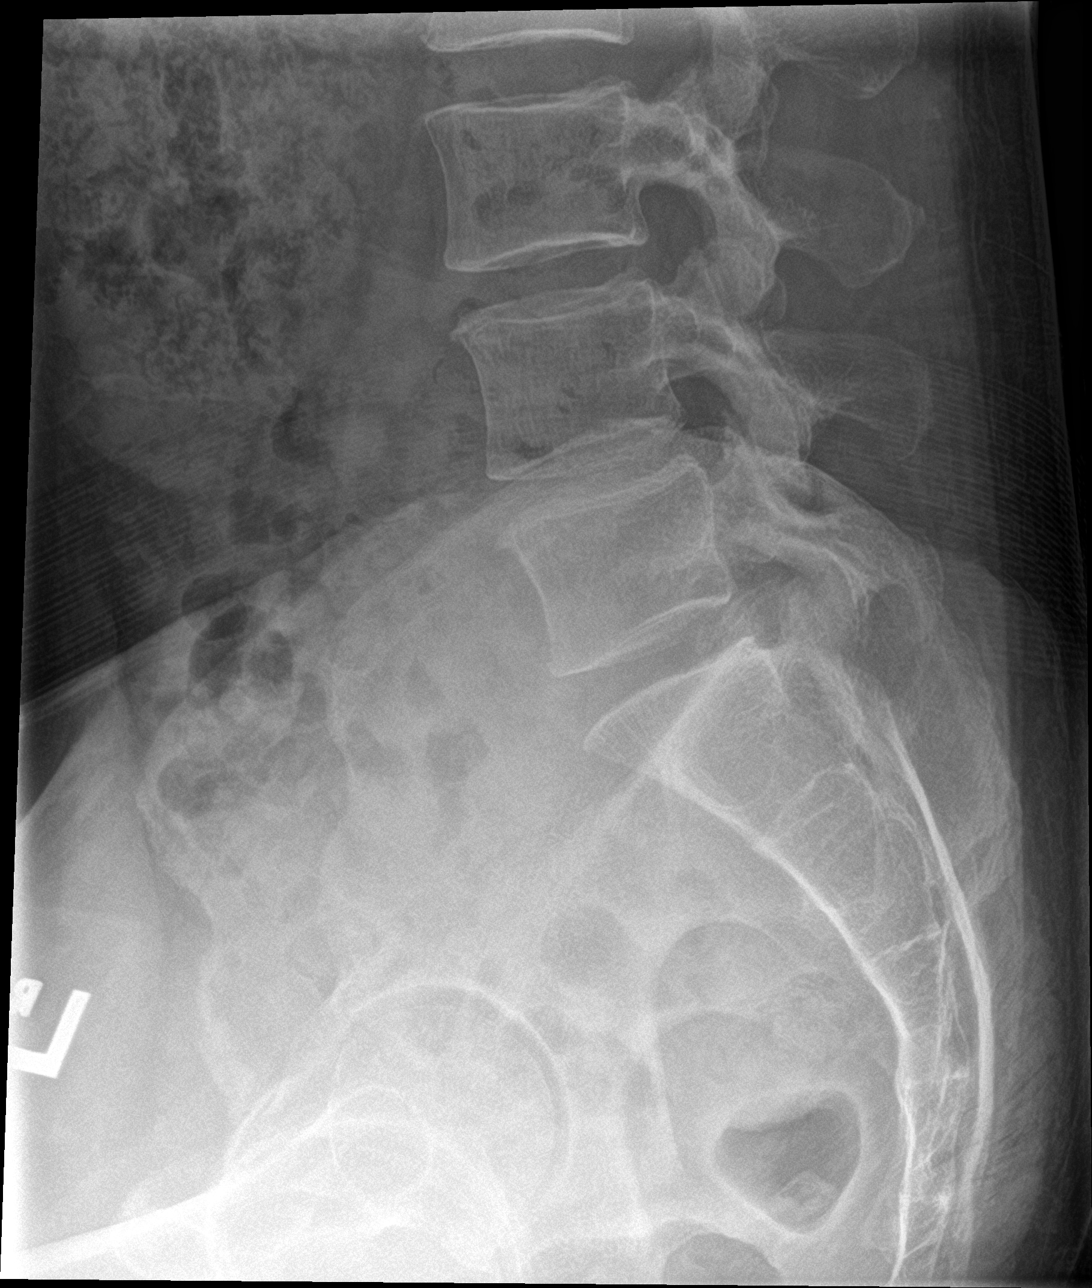

[5 of 5 positions shown; findings below may reference images not displayed]

FINDINGS: There is no evidence of lumbar spine fracture. Alignment is normal.
Intervertebral disc spaces are maintained.
IMPRESSION: Negative.

## 2017-09-14 ENCOUNTER — Encounter: Payer: Self-pay | Admitting: Sports Medicine

## 2017-09-14 ENCOUNTER — Ambulatory Visit (INDEPENDENT_AMBULATORY_CARE_PROVIDER_SITE_OTHER): Payer: Managed Care, Other (non HMO) | Admitting: Sports Medicine

## 2017-09-14 VITALS — BP 120/80 | Ht 70.0 in | Wt 165.0 lb

## 2017-09-14 DIAGNOSIS — M792 Neuralgia and neuritis, unspecified: Secondary | ICD-10-CM | POA: Diagnosis not present

## 2017-09-14 DIAGNOSIS — G2581 Restless legs syndrome: Secondary | ICD-10-CM | POA: Diagnosis not present

## 2017-09-14 MED ORDER — CLONAZEPAM 0.5 MG PO TABS: 0.5000 mg | ORAL_TABLET | Freq: Two times a day (BID) | ORAL | 2 refills | Status: DC

## 2017-09-14 NOTE — Progress Notes (Signed)
Chief complaint: Follow-up of chronic left foot pain, with suspicion of peroneal nerve irritation; now with left dorsal midfoot pain 2 months  History of present illness: Benjamin Zimmerman is a 57 year old male presents to sports medicine office today for follow-up of chronic left foot pain.  Does have history of neuropathic left foot pain, secondary to common peroneal nerve irritation.  Back in October 2018, he did have hydrodissection of the peroneal nerve with improvement in his symptoms.    He describes having cramps in his foot, pointing to the second, third, and fourth metatarsals as the epicenter as to where he feels the cramps.  He reports that this is mainly bothersome at nighttime.  At last appointment, he was prescribed Klonopin 0.5 mg to take nightly.  He reports that this is helped, mainly with getting him to sleep.  Fortunately, he does not report of having any cramps that wake him up from sleep at nighttime.  He reports over the last few months noticing pain more so in the anterior lateral ankle near the sinus tarsi region.  Does not report of any swelling, redness, warmth, or ecchymosis.  He is an avid biker, but has not been biking as frequently due to weather.  He does have custom orthotics made for him, he has a different pair of orthotics today and his stress shoes.  He reports that is about 69-8 months old.  He does not report of any numbness, tingling, burning paresthesias.  Reports while sitting down at times he will feel irritation in anterolateral left ankle will have to massage.    Of note, he reports that he does often donate blood, as frequently as every 8 weeks.  He does report in the past he has been told that he has been anemic and has been on iron supplementation.  He has been using vitamin B supplementation, is not sure if it is vitamin B6.  Review of systems:  As stated above  Interval past medical history, surgical history, family history, and social history obtained and unchanged.  His past medical history is notable for hypothyroidism, thyroid cancer, nephrolithiasis, anxiety, and depression; surgical history notable for thyroidectomy; does not report of any current tobacco use; no family history of diabetes; allergies and medications are reviewed and are reflected in EMR.  Physical exam: Vital signs are reviewed and are documented in the chart Gen.: Alert, oriented, appears stated age, in no apparent distress HEENT: Moist oral mucosa Respiratory: Normal respirations, able to speak in full sentences Cardiac: Regular rate, distal pulses 2+ Integumentary: No rashes on visible skin:  Neurologic: He actually does have intact strength with left ankle dorsiflexion, plantarflexion, inversion, and eversion, would categorize strength as 5/5, sensation 2+ in bilateral lower extremities Psych: Normal affect, mood is described as good Musculoskeletal: Inspection of left foot reveals no obvious deformities or muscle atrophy, no warmth, erythema, ecchymosis, or effusion, he is slightly tender to deep palpation over the anterolateral left ankle region near the sinus tarsi, no puffiness or swelling noted in that area, no TTP over the peroneals, fibular head, medial, or lateral malleolus, he has full ROM and strength without any elicitation of pain, on foot evaluation it appears his 2nd toe does splay out, indicating his tendency to pronate, does have flattened transverse arch on both sides, does have slight laxity at the 4th TMT  Assessment and plan: 1. Peroneal neuropathic pain of left foot 2. Concern for iron deficiency anemia causing symptomatic restless leg syndrome  Plan: Discussed with Benjamin Zimmerman that  most likely this is all stemming back to the common peroneal nerve being irritated. Discussed biomechanical issue at play such as laxity of the 4th TMT and ankle pronation. Discussed will have him fitted for arch strap today. Do not see any evidence on exam that he has sinus tarsi syndrome.  Do have concerns with the frequency of his blood donations. He reports that at last donation he was told his hemoglobin was 11.8. Discussed concern of him developing iron deficiency anemia after blood donation and this subsequently causing symptomatic restless leg syndrome. He does have an appointment with his primary care physician in the next few weeks. Created letter to request that his primary care physician check a CBC and ferritin at next visit. Discussed to verify that his vitamin supplementation is vitamin B6 and to use at least 50 mg daily. Will refill klonopin 0.5 mg nightly as this is controlling his foot cramping and not waking him up from sleep. Will have him return in 3 months or sooner as needed.   Benjamin Zimmerman, M.D. [facility]  I observed and examined the patient with the [facility] fellow and agree with assessment and plan.  Note reviewed and modified by me. Benjamin Libel, MD

## 2017-09-14 NOTE — Patient Instructions (Signed)
Dr Jacelyn Grip Please check a CBC and Ferritin with his routine labs as he has been low on both of these in the past Could be an association with his leg cramping  Trial with arch strap  Be sure you have enough arch support in various shoes  Make sure vitamin b6 is at least 50 mg

## 2017-11-23 ENCOUNTER — Other Ambulatory Visit: Payer: Self-pay

## 2017-11-23 MED ORDER — CLONAZEPAM 0.5 MG PO TABS: 0.5000 mg | ORAL_TABLET | Freq: Two times a day (BID) | ORAL | 2 refills | Status: DC

## 2018-01-04 ENCOUNTER — Ambulatory Visit (INDEPENDENT_AMBULATORY_CARE_PROVIDER_SITE_OTHER): Payer: Managed Care, Other (non HMO) | Admitting: Sports Medicine

## 2018-01-04 ENCOUNTER — Encounter: Payer: Self-pay | Admitting: Sports Medicine

## 2018-01-04 VITALS — BP 112/72 | Ht 70.0 in | Wt 164.0 lb

## 2018-01-04 DIAGNOSIS — G2581 Restless legs syndrome: Secondary | ICD-10-CM | POA: Diagnosis not present

## 2018-01-04 DIAGNOSIS — M792 Neuralgia and neuritis, unspecified: Secondary | ICD-10-CM

## 2018-01-04 MED ORDER — FERROUS SULFATE 325 (65 FE) MG PO TBEC: 325.0000 mg | DELAYED_RELEASE_TABLET | Freq: Three times a day (TID) | ORAL | 1 refills | Status: AC

## 2018-01-04 NOTE — Progress Notes (Signed)
   HPI  CC: Left foot cramping  [name] is a 57 year old male with history of microcytic anemia,Hypothyroidism Anxiety and depression who presents today for left foot cramping follow-up.He was previously seen in April this year in this clinic.  At that and he was started on Klonopin for nighttime use.  He states this does not help his nighttime cramping.  He describes the feeling is not necessarily a painful cramp, but more of a stiffness and discomfort in his at all 3 toes on his left foot.  He states this pain sometimes comes up into the back of his leg.  He states has been doing this around for years.  He had a recent workup with his primary care, with a macrocytic anemia with a hemoglobin of 11.8.  His ferritin at that time was also 7.4 with an iron level 46.he states that since that time he has started over-the-counter iron supplementation.  He states he will occasionally take it once a day, and does not know how much iron is actually in a supplement.  He states his symptoms of been unchanged since starting it.  He denies any numbness and tingling in the foot since the last visit.  He denies any weakness in the foot.  See HPI and/or previous note for associated ROS.  Objective: BP 112/72   Ht 5\' 10"  (1.778 m)   Wt 164 lb (74.4 kg)   BMI 23.53 kg/m    Gen: right-Hand Dominant. NAD, well groomed, a/o x3, normal affect.  CV: Well-perfused. Warm.  Resp: Non-labored.  Neuro: Sensation intact throughout. No gross coordination deficits.  Gait: Nonpathologic posture, unremarkable stride without signs of limp or balance issues.  Right foot exam: No swelling, warmth, erythema noted.  No tenderness to palpation noted along the foot.full range of motion throughout all testing..  Strength 5 out of 5 throughout all testing.  He does have second toe splaying and a flattening of transverse arch.  Negative anterior drawer.  Assessment and plan:  Restless Leg syndrome, likely secondary to low iron  and ferritin.  We will start patient onWe will start patient on iron sulfate 325 mg 3 times a day at this time. We advised him this could cause constipation and recommend starting docusate as well for a stool softener. We will obtain CBC and ferritin level in 4 weeks.We also discussed with patient that he should stop giving blood until further evaluation of his iron deficiency has been worked up.  Continue with vitamin B6 Supplementation.  Return to clinic in 5 weeks for reevaluation.If he is not improved at this time, we can consider getting an EMG for further evaluation of the patient's neurogenic pain.  Benjamin Rife, MD [facility] Fellow 01/04/2018 2:26 PM   I observed and examined the patient with the [facility] Fellow and agree with assessment and plan.  Note reviewed and modified by me. Benjamin Libel, MD

## 2018-01-04 NOTE — Patient Instructions (Signed)
Get your CBC and Ferritin levels tested in 4 weeks. Call us the day of or the day before you would like to get the test done so we can give the lab a heads up. You will get this done at the [facility]. [place], [place] Follow up with [place] in 5 weeks.

## 2018-02-10 ENCOUNTER — Other Ambulatory Visit: Payer: Self-pay | Admitting: *Deleted

## 2018-02-10 DIAGNOSIS — D509 Iron deficiency anemia, unspecified: Secondary | ICD-10-CM

## 2018-02-11 ENCOUNTER — Other Ambulatory Visit: Payer: Self-pay

## 2018-02-11 DIAGNOSIS — D509 Iron deficiency anemia, unspecified: Secondary | ICD-10-CM

## 2018-02-12 LAB — CBC
HEMATOCRIT: 42.3 % (ref 37.5–51.0)
HEMOGLOBIN: 14.2 g/dL (ref 13.0–17.7)
MCH: 28.5 pg (ref 26.6–33.0)
MCHC: 33.6 g/dL (ref 31.5–35.7)
MCV: 85 fL (ref 79–97)
Platelets: 300 10*3/uL (ref 150–450)
RBC: 4.98 x10E6/uL (ref 4.14–5.80)
RDW: 15.3 % (ref 12.3–15.4)
WBC: 4 10*3/uL (ref 3.4–10.8)

## 2018-02-12 LAB — FERRITIN: FERRITIN: 43 ng/mL (ref 30–400)

## 2018-02-15 ENCOUNTER — Ambulatory Visit (INDEPENDENT_AMBULATORY_CARE_PROVIDER_SITE_OTHER): Payer: Managed Care, Other (non HMO) | Admitting: Sports Medicine

## 2018-02-15 ENCOUNTER — Encounter: Payer: Self-pay | Admitting: Sports Medicine

## 2018-02-15 VITALS — BP 102/62 | Ht 70.0 in | Wt 164.0 lb

## 2018-02-15 DIAGNOSIS — G2581 Restless legs syndrome: Secondary | ICD-10-CM

## 2018-02-15 NOTE — Progress Notes (Signed)
   HPI  CC: Left foot cramping  Benjamin Zimmerman is a 57 year old male who presents for follow-up of left foot cramping.  He is found to have iron deficiency anemia at his last visit.  We obtained a CBC and a ferritin level, after 4 weeks of placing him on ferrous sulfate 325 twice daily therapy.  His hemoglobin went from 11.8-14.2 on the repeat.  His ferritin level went from 7.4-43 on repeat.  He states that he continues to have the cramping into the first 3 toes on his left foot.  He denies any numbness and tingling to foot.  He denies any weakness in the foot.  He states he has been taking the iron as prescribed.  He is also taking stool softener and has no bowel issues.  He denies any injury to the foot.  He states he still is riding 100 miles a week on his bike.  See HPI and/or previous note for associated ROS.  Objective: BP 102/62   Ht 5\' 10"  (1.778 m)   Wt 164 lb (74.4 kg)   BMI 23.53 kg/m  Gen: NAD, well groomed, a/o x3, normal affect.  CV: Well-perfused. Warm.  Resp: Non-labored.  Neuro: Sensation intact throughout. No gross coordination deficits.  Gait: Nonpathologic posture, unremarkable stride without signs of limp or balance issues.  Foot exam: No erythema, warmth, swelling.  No tenderness palpation on exam.  Full range of motion in dorsiflexion, plantarflexion, inversion, eversion.  Strength 5 out of 5 throughout testing.  Negative anterior drawer.  Assessment and plan: Restless leg syndrome, secondary to iron deficiency anemia.  We discussed with Benjamin Zimmerman today that it will take several months to likely build up the stores of iron that he has been depleted on.  We will continue with ferrous sulfate 325 twice daily at this time.  Our hope is that as his iron stores increased, his restless leg will begin to improve.  We will follow him up in 6 to 8 weeks.  If at that time he has no improvement, or worsening of symptoms, we should consider getting an EMG for nerve conduction studies.  Benjamin Rife, MD [facility] Fellow 02/15/2018 12:29 PM

## 2018-06-21 ENCOUNTER — Other Ambulatory Visit: Payer: Self-pay | Admitting: Sports Medicine

## 2018-06-21 MED ORDER — CLONAZEPAM 0.5 MG PO TABS: 0.5000 mg | ORAL_TABLET | Freq: Two times a day (BID) | ORAL | 2 refills | Status: DC

## 2018-06-23 ENCOUNTER — Encounter: Payer: Self-pay | Admitting: Sports Medicine

## 2018-06-23 ENCOUNTER — Other Ambulatory Visit: Payer: Managed Care, Other (non HMO)

## 2018-06-23 ENCOUNTER — Ambulatory Visit (INDEPENDENT_AMBULATORY_CARE_PROVIDER_SITE_OTHER): Payer: Managed Care, Other (non HMO) | Admitting: Sports Medicine

## 2018-06-23 VITALS — BP 116/70 | Ht 70.0 in | Wt 165.0 lb

## 2018-06-23 DIAGNOSIS — M25562 Pain in left knee: Secondary | ICD-10-CM | POA: Diagnosis not present

## 2018-06-23 DIAGNOSIS — G609 Hereditary and idiopathic neuropathy, unspecified: Secondary | ICD-10-CM | POA: Diagnosis not present

## 2018-06-23 DIAGNOSIS — G2581 Restless legs syndrome: Secondary | ICD-10-CM | POA: Diagnosis not present

## 2018-06-23 NOTE — Patient Instructions (Addendum)
[facility] [address], [place], [place] [address] Phone: [phone] Tuesday March 24th at 130pm Arrival time is 1pm

## 2018-06-23 NOTE — Progress Notes (Signed)
  QUINTRELL BAZE - 58 y.o. male MRN [id number]  Date of birth: [birth year]    SUBJECTIVE:      Chief Complaint: Left leg pain  HPI:  58 year old male with long history of left leg pain returns today for follow-up.  It has been felt that he has symptoms consistent with restless leg syndrome possibly related to a iron deficiency anemia.  He has been taking iron supplementation regularly twice daily.  Overall he is not noticed any significant change in his symptoms.  In fact, recently he has noted some aching pain in his gluten lateral thigh of his left leg.  He did start using heat which she does find somewhat helpful.  He has not been continuing biking recently but this is mostly due to weather.  In the past he has been trialed on gabapentin which has not been helpful.  Overall, he continues to have cramping pain in the lateral foot with the addition of new aching pain in the lateral thigh and glute.  He denies any numbness or tingling.  He denies any weakness.  No skin changes   ROS:     See HPI  PERTINENT  PMH / PSH FH / / SH:  Past Medical, Surgical, Social, and Family History Reviewed & Updated in the EMR.  Pertinent findings include:  Microcytic anemia  OBJECTIVE: BP 116/70   Ht 5\' 10"  (1.778 m)   Wt 165 lb (74.8 kg)   BMI 23.68 kg/m   Physical Exam:  Vital signs are reviewed.  GEN: Alert and oriented, NAD Pulm: Breathing unlabored PSY: normal mood, congruent affect  MSK: Left leg: No obvious deformity or swelling full range of motion hip, knee, and ankle 5/5 strength with hip flexion and abduction.  5/5 strength with knee flexion extension.  5/5 strength with ankle dorsiflexion and plantarflexion Sensation intact in all dermatomes 2+ DTRs   ASSESSMENT & PLAN:  1.  Left leg pain-patient continues to have seemingly neuropathic pain in the left leg.  Unremarkable exam today -We will repeat CBC and ferritin -B12 -Magnesium level -Referral to neurology for further  evaluation -Follow-up as needed  I observed and examined the patient with the Dr. Okey Dupre, [facility] and agree with assessment and plan.  Note reviewed and modified by me. Ila Mcgill, MD

## 2018-06-24 LAB — MAGNESIUM: Magnesium: 2.3 mg/dL (ref 1.6–2.3)

## 2018-06-24 LAB — CBC
Hematocrit: 43.8 % (ref 37.5–51.0)
Hemoglobin: 14.8 g/dL (ref 13.0–17.7)
MCH: 29.8 pg (ref 26.6–33.0)
MCHC: 33.8 g/dL (ref 31.5–35.7)
MCV: 88 fL (ref 79–97)
PLATELETS: 282 10*3/uL (ref 150–450)
RBC: 4.96 x10E6/uL (ref 4.14–5.80)
RDW: 11.9 % (ref 11.6–15.4)
WBC: 4.7 10*3/uL (ref 3.4–10.8)

## 2018-06-24 LAB — VITAMIN B12: VITAMIN B 12: 540 pg/mL (ref 232–1245)

## 2018-06-24 LAB — FERRITIN: Ferritin: 91 ng/mL (ref 30–400)

## 2018-07-05 ENCOUNTER — Telehealth: Payer: Self-pay

## 2018-07-05 NOTE — Telephone Encounter (Signed)
Per Dr. Oneida Alar: Ferritin was 91 so he will cut his iron to 1 tab 3 times a week. CBC, B12 and Magnesium levels were all good. Pt understands.

## 2018-08-16 ENCOUNTER — Ambulatory Visit: Payer: Self-pay | Admitting: Neurology

## 2018-10-13 ENCOUNTER — Encounter: Payer: Self-pay | Admitting: Neurology

## 2018-10-13 ENCOUNTER — Ambulatory Visit (INDEPENDENT_AMBULATORY_CARE_PROVIDER_SITE_OTHER): Payer: Managed Care, Other (non HMO) | Admitting: Neurology

## 2018-10-13 ENCOUNTER — Other Ambulatory Visit: Payer: Self-pay

## 2018-10-13 VITALS — BP 118/63 | HR 62 | Temp 98.0°F | Ht 70.0 in | Wt 176.0 lb

## 2018-10-13 DIAGNOSIS — G5603 Carpal tunnel syndrome, bilateral upper limbs: Secondary | ICD-10-CM

## 2018-10-13 DIAGNOSIS — M7918 Myalgia, other site: Secondary | ICD-10-CM

## 2018-10-13 DIAGNOSIS — M79652 Pain in left thigh: Secondary | ICD-10-CM

## 2018-10-13 DIAGNOSIS — G5732 Lesion of lateral popliteal nerve, left lower limb: Secondary | ICD-10-CM

## 2018-10-13 DIAGNOSIS — S76912A Strain of unspecified muscles, fascia and tendons at thigh level, left thigh, initial encounter: Secondary | ICD-10-CM

## 2018-10-13 DIAGNOSIS — M722 Plantar fascial fibromatosis: Secondary | ICD-10-CM

## 2018-10-13 NOTE — Patient Instructions (Addendum)
Peroneal neuropathy - at knee? Consider EMG/NCS in the future Numbness in hands: likely CTS, +Mcphalen's maneuver. Consider emg/ncs in the future. Splints, conservative measures, injections Left lateral thigh soreness: could be lateral femoral cutaneous nerve irritation or possible L3 radiculopathy or muscular. Dry Needling. Cervical Myofascial Pain syndrome and musculoskeletal left lateral thigh: Dry Needling. [address]. He is going to think about EMG/NCS would inject at the carpal tunnel in the meantime  Xray left leg knee area to evaluate for bony abnormalities causing peroneal neuropathy. MRI of the lower extremity if not etiologies found. Special attention to the peroneal nerve from the lateral knee to the foot.  Try topical medications or lidocaine patches OTC for neck  Carpal Tunnel Syndrome  Carpal tunnel syndrome is a condition that causes pain in your hand and arm. The carpal tunnel is a narrow area that is on the palm side of your wrist. Repeated wrist motion or certain diseases may cause swelling in the tunnel. This swelling can pinch the main nerve in the wrist (median nerve). What are the causes? This condition may be caused by:  Repeated wrist motions.  Wrist injuries.  Arthritis.  A sac of fluid (cyst) or abnormal growth (tumor) in the carpal tunnel.  Fluid buildup during pregnancy. Sometimes the cause is not known. What increases the risk? The following factors may make you more likely to develop this condition:  Having a job in which you move your wrist in the same way many times. This includes jobs like being a Software engineer or a Scientist, water quality.  Being a woman.  Having other health conditions, such as: ? Diabetes. ? Obesity. ? A thyroid gland that is not active enough (hypothyroidism). ? Kidney failure. What are the signs or symptoms? Symptoms of this condition include:  A tingling feeling in your fingers.  Tingling or a loss of feeling (numbness) in your hand.   Pain in your entire arm. This pain may get worse when you bend your wrist and elbow for a long time.  Pain in your wrist that goes up your arm to your shoulder.  Pain that goes down into your palm or fingers.  A weak feeling in your hands. You may find it hard to grab and hold items. You may feel worse at night. How is this diagnosed? This condition is diagnosed with a medical history and physical exam. You may also have tests, such as:  Electromyogram (EMG). This test checks the signals that the nerves send to the muscles.  Nerve conduction study. This test checks how well signals pass through your nerves.  Imaging tests, such as X-rays, ultrasound, and MRI. These tests check for what might be the cause of your condition. How is this treated? This condition may be treated with:  Lifestyle changes. You will be asked to stop or change the activity that caused your problem.  Doing exercise and activities that make bones and muscles stronger (physical therapy).  Learning how to use your hand again (occupational therapy).  Medicines for pain and swelling (inflammation). You may have injections in your wrist.  A wrist splint.  Surgery. Follow these instructions at home: If you have a splint:  Wear the splint as told by your doctor. Remove it only as told by your doctor.  Loosen the splint if your fingers: ? Tingle. ? Lose feeling (become numb). ? Turn cold and blue.  Keep the splint clean.  If the splint is not waterproof: ? Do not let it get wet. ? Cover  it with a watertight covering when you take a bath or a shower. Managing pain, stiffness, and swelling   If told, put ice on the painful area: ? If you have a removable splint, remove it as told by your doctor. ? Put ice in a plastic bag. ? Place a towel between your skin and the bag. ? Leave the ice on for 20 minutes, 2-3 times per day. General instructions  Take over-the-counter and prescription medicines only as  told by your doctor.  Rest your wrist from any activity that may cause pain. If needed, talk with your boss at work about changes that can help your wrist heal.  Do any exercises as told by your doctor, physical therapist, or occupational therapist.  Keep all follow-up visits as told by your doctor. This is important. Contact a doctor if:  You have new symptoms.  Medicine does not help your pain.  Your symptoms get worse. Get help right away if:  You have very bad numbness or tingling in your wrist or hand. Summary  Carpal tunnel syndrome is a condition that causes pain in your hand and arm.  It is often caused by repeated wrist motions.  Lifestyle changes and medicines are used to treat this problem. Surgery may help in very bad cases.  Follow your doctor's instructions about wearing a splint, resting your wrist, keeping follow-up visits, and calling for help. This information is not intended to replace advice given to you by your health care provider. Make sure you discuss any questions you have with your health care provider. Document Released: 04/30/2011 Document Revised: 09/17/2017 Document Reviewed: 09/17/2017 Elsevier Interactive Patient Education  2019 [employer].  Common Peroneal Nerve Entrapment  Common peroneal nerve entrapment is a condition that can make it hard to lift a foot. The condition results from pressure on a nerve in the lower leg called the common peroneal nerve. Your common peroneal nerve provides feeling to your outer lower leg and foot. It also supplies the muscles that move your foot and toes upward and outward. What are the causes? This condition may be caused by:  A hard, direct hit to the side of the lower leg.  Swelling from a knee injury.  A break (fracture) in one of the lower leg bones.  Wearing a boot or cast that ends just below the knee.  A growth or cyst near the nerve. What increases the risk? This condition is more likely to  develop in people who play:  Contact sports, such as football or hockey.  Sports in which you wear high and stiff boots, such as skiing. What are the signs or symptoms? Symptoms of this condition include:  Trouble lifting your foot up.  Tripping often.  Your foot hitting the ground harder than normal as you walk.  Numbness, tingling, or pain in the outside of the knee, outside of the lower leg, and top of the foot.  Sensitivity to pressure on the front or side of the leg. How is this diagnosed? This condition may be diagnosed based on:  Your symptoms.  Your medical history.  A physical exam.  Tests, such as: ? An X-ray to check the bones of your knee and leg. ? MRI to check tendons that attach to the side of your knee. ? An electromyogram (EMG) to check your nerves. During your physical exam, your health care provider will check for numbness in your leg and test the strength of your lower leg muscles. He or she  may tap the side of your lower leg to see if that causes tingling. How is this treated? Treatment for this condition may include:  Avoiding activities that make symptoms worse.  Using a brace to hold up your foot and toes.  Taking anti-inflammatory pain medicines to relieve swelling and reduce pain.  Having medicines injected into your ankle joint to reduce pain and swelling.  Physical therapy. This involves doing exercises.  Returning gradually to full activity.  Surgery to take pressure off the nerve. This may be needed if there is no improvement after 2-3 months or if there is a growth pushing on the nerve. Follow these instructions at home: If you have a brace:  Wear it as told by your health care provider. Remove it only as told by your health care provider.  Loosen the brace if your toes tingle, become numb, or turn cold and blue.  Keep the brace clean.  If the brace is not waterproof: ? Do not let it get wet. ? Cover it with a watertight  covering when you take a bath or a shower.  Ask your health care provider when it is safe to drive with a brace on your foot. Activity  Return to your normal activities as told by your health care provider. Ask your health care provider what activities are safe for you.  Do not do any activities that make pain or swelling worse.  Do exercises as told by your health care provider. General instructions  Take over-the-counter and prescription medicines only as told by your health care provider.  Do not put your full weight on your knee until your health care provider says you can.  Keep all follow-up visits as told by your health care provider. This is important. How is this prevented?  Wear supportive footwear that is appropriate for your athletic activity.  Avoid athletic activities that cause ankle pain or swelling.  Wear protective padding over your lower legs when playing contact sports.  Make sure your boots do not put extra pressure on the area just below your knees.  Do not sit cross-legged for long periods of time. Contact a health care provider if:  Your symptoms do not get better in 2-3 months.  The weakness or numbness in your leg or foot gets worse. This information is not intended to replace advice given to you by your health care provider. Make sure you discuss any questions you have with your health care provider. Document Released: 05/11/2005 Document Revised: 01/14/2016 Document Reviewed: 03/29/2015 Elsevier Interactive Patient Education  2019 [employer].

## 2018-10-13 NOTE — Progress Notes (Signed)
[facility]    Provider:  Dr Jaynee Eagles Requesting Provider: Stefanie Libel MD Primary Care Provider:  Vernie Shanks, MD  CC:  Left leg pain, foot pain, neck pain, hand numbness  HPI:  Benjamin Zimmerman is a 58 y.o. male here as requested by Dr. Oneida Alar for arthralgia of the left lower leg.  Patient states he has experienced nagging left leg pain for over 5 years.  The pain is mostly located in the lateral thigh and top of left foot and ankle and 3rd metatarsal.  He also reports left foot cramps.  He states it is worse in the morning.  He denies burning, tingling, numbness or weakness.  He is able to bike daily.  Hasn't limited daily activities.  History of plantar fasciitis.    Reviewed notes, labs and imaging from outside physicians, which showed:  This is a 58 year old with a long history of left leg pain.  I reviewed notes from Dr. Oneida Alar office.  It has been felt that he has symptoms consistent with restless leg syndrome possibly related to an iron deficiency anemia.  Taking iron supplementation without significant change in his symptoms.  At appointment in January he noted some aching pain in his glutes, lateral thigh of his left leg, he did start using heat which he does find somewhat helpful.  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 Started 5 years ago or more in the foot, top of the foot at the third metatarsal, foot stable, not worse or better however different shoes make it worse. Cramping. 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 The other leg is unaffected. Sleeping makes it worse. E has new lateral thigh issue in the last 1-2 years, he lost 20 pounds a few years ago but not associated with the nerve issue. Lateral thigh, sore muscle, no numbness or tingling or pain. Heat and ice helps. Massage helps. No weakness. Consistent, unknown triggers. Walking makes it worse. Sore lateral thigh. Does not radiate past the knee. Numbness in the hands, worse at night, splints  help. No weakness.  Personally reviewed MRI images and agree with the following: 1. L3-4 left extra foraminal protrusion which contacts but does not compress the L3 nerve. This could be a source of irritation. 2. L4-5 right foraminal protrusion which contacts but does not compress the L4 nerve. This could be a source of irritation. 3. L5-S1 annular fissure.  Review of Systems: Patient complains of symptoms per HPI as well as the following symptoms: muscle pain, foot pain. Pertinent negatives and positives per HPI. All others negative.   Social History   Socioeconomic History  . Marital status: Married    Spouse name: Not on file  . Number of children: 2  . Years of education: Not on file  . Highest education level: Master's degree (e.g., MA, MS, MEng, MEd, MSW, MBA)  Occupational History  . Not on file  Social Needs  . Financial resource strain: Not on file  . Food insecurity:    Worry: Not on file    Inability: Not on file  . Transportation needs:    Medical: Not on file    Non-medical: Not on file  Tobacco Use  . Smoking status: Never Smoker  . Smokeless tobacco: Never Used  Substance and Sexual Activity  . Alcohol use: Yes    Alcohol/week: 6.0 standard drinks    Types: 6 Cans of beer per week    Comment: a few beers a  week, usually on weekends  . Drug use: Never  . Sexual activity: Not on file  Lifestyle  . Physical activity:    Days per week: Not on file    Minutes per session: Not on file  . Stress: Not on file  Relationships  . Social connections:    Talks on phone: Not on file    Gets together: Not on file    Attends religious service: Not on file    Active member of club or organization: Not on file    Attends meetings of clubs or organizations: Not on file    Relationship status: Not on file  . Intimate partner violence:    Fear of current or ex partner: Not on file    Emotionally abused: Not on file    Physically abused: Not on file    Forced sexual  activity: Not on file  Other Topics Concern  . Not on file  Social History Narrative   Lives at home with his wife   Right handed    Family History  Problem Relation Age of Onset  . Diabetes Mother   . High blood pressure Mother   . Obesity Mother   . Alzheimer's disease Mother   . Breast cancer Mother   . Obesity Father   . High blood pressure Father   . Heart disease Father   . Heart attack Father   . Neuropathy Neg Hx     Past Medical History:  Diagnosis Date  . Arthralgia    LLE  . Kidney stones   . Plantar fasciitis    bilateral    Patient Active Problem List   Diagnosis Date Noted  . Peroneal neuropathy at knee, left 10/14/2018  . Ganglion of flexor tendon sheath of left middle finger 04/20/2017  . Anxiety and depression 02/27/2016  . Neuropathic pain of foot, left 09/10/2015  . Morning joint stiffness 09/10/2015  . Cervical disc disorder with radiculopathy of cervical region 04/10/2015  . Floater, vitreous 02/12/2015  . Disease of thyroid gland 02/12/2015  . High myopia 02/12/2015  . Hypothyroidism, postop 08/15/2014  . Malignant neoplasm of thyroid gland ([place]) 08/15/2014  . Postoperative hypothyroidism 08/15/2014  . Plantar fasciitis of left foot 05/16/2012  . Anemia 05/16/2012  . Calculus of kidney 06/06/2011    Past Surgical History:  Procedure Laterality Date  . HERNIA REPAIR     as a child     Current Outpatient Medications  Medication Sig Dispense Refill  . B Complex Vitamins (VITAMIN B COMPLEX PO) Take by mouth. Super B 100    . Cholecalciferol (VITAMIN D3 PO) Take by mouth.    . DOCUSATE SODIUM PO Take 1 tablet by mouth 2 (two) times daily.    . ferrous sulfate 325 (65 FE) MG EC tablet Take 1 tablet (325 mg total) by mouth 3 (three) times daily with meals. (Patient taking differently: Take 325 mg by mouth 4 (four) times a week. ) 90 tablet 1  . levothyroxine (SYNTHROID, LEVOTHROID) 125 MCG tablet Take 1 po six days a week and 1/2 on sundays     . AFLURIA PRESERVATIVE FREE 0.5 ML SUSY Reported on 09/10/2015  0  . Lidocaine (HM LIDOCAINE PATCH) 4 % PTCH Apply 1 patch topically every 12 (twelve) hours as needed. 5 patch 0   No current facility-administered medications for this visit.     Allergies as of 10/13/2018 - Review Complete 10/13/2018  Allergen Reaction Noted  . Keflex [cephalexin]  05/16/2012  Vitals: BP 118/63 (BP Location: Right Arm, Patient Position: Sitting)   Pulse 62   Temp 98 F (36.7 C) Comment: taken by front staff upon arrival  Ht 5\' 10"  (1.778 m)   Wt 176 lb (79.8 kg)   BMI 25.25 kg/m  Last Weight:  Wt Readings from Last 1 Encounters:  10/13/18 176 lb (79.8 kg)   Last Height:   Ht Readings from Last 1 Encounters:  10/13/18 5\' 10"  (1.778 m)     Physical exam: Exam: Gen: NAD, conversant, well nourised, obese, well groomed                     CV: RRR, no MRG. No Carotid Bruits. No peripheral edema, warm, nontender Eyes: Conjunctivae clear without exudates or hemorrhage  Neuro: Detailed Neurologic Exam  Speech:    Speech is normal; fluent and spontaneous with normal comprehension.  Cognition:    The patient is oriented to person, place, and time;     recent and remote memory intact;     language fluent;     normal attention, concentration,     fund of knowledge Cranial Nerves:    The pupils are equal, round, and reactive to light. The fundi are normal and spontaneous venous pulsations are present. Visual fields are full to finger confrontation. Extraocular movements are intact. Trigeminal sensation is intact and the muscles of mastication are normal. The face is symmetric. The palate elevates in the midline. Hearing intact. Voice is normal. Shoulder shrug is normal. The tongue has normal motion without fasciculations.   Coordination:    Normal finger to nose and heel to shin. Normal rapid alternating movements.   Gait:    Heel-toe and tandem gait are normal.   Motor Observation:     No asymmetry, no atrophy, and no involuntary movements noted. Tone:    Normal muscle tone.    Posture:    Posture is normal. normal erect    Strength:    Strength is V/V in the upper and lower limbs.      Sensation: intact to LT     Reflex Exam:  DTR's:    Deep tendon reflexes in the upper and lower extremities are normal bilaterally.   Toes:    The toes are downgoing bilaterally.   Clonus:    Clonus is absent.    Assessment/Plan:  58  Year old with sensory changes in a peroneal nerve distribution on the left lower leg. MRI lumbar spine did not show any etiology. He also has hand numbness likely Carpal Tunnel Syndrome, tightness in the cervical muscle (myofascial cervical pain), and left leg muscular-type pain also plantar fasciitis.  Peroneal sensory neuropathy - at knee? Consider EMG/NCS in the future. Xray for bony abnormality. If negative, order MRI of the left lower extremity with Special attention to the peroneal nerve from the lateral knee to the foot.   Numbness in hands: likely CTS, +Mcphalen's maneuver. Consider emg/ncs in the future. Splints, conservative measures, injections Left lateral thigh soreness: Likely muscular. Can't rule out lateral femoral cutaneous nerve irritation or possible L3 radiculopathy. Dry Needling. Cervical Myofascial Pain syndrome, plantar fasciitis and musculoskeletal left lateral thigh: Dry Needling. [address]. - Follow up: We will see what the imaging shows and then decide follow up. Discussed emg/ncs, patient declines at this time. If he changes his mind he can contact us to order.  Try topical medications(voltaren) or lidocaine patches OTC for neck. Heat, massage, ice, stretching. Declines PT but would  like dry needling.  Orders Placed This Encounter  Procedures  . DG Knee Complete 4 Views Left  . Ambulatory referral to Physical Therapy   Meds ordered this encounter  Medications  . Lidocaine (HM LIDOCAINE PATCH) 4 % PTCH    Sig:  Apply 1 patch topically every 12 (twelve) hours as needed.    Dispense:  5 patch    Refill:  0    Cc: Vernie Shanks, MD,  Stefanie Libel MD  Sarina Ill, MD  [facility] [address] [place], [place] [address]  Phone [phone] Fax [phone]

## 2018-10-14 DIAGNOSIS — G5732 Lesion of lateral popliteal nerve, left lower limb: Secondary | ICD-10-CM | POA: Insufficient documentation

## 2018-10-14 MED ORDER — LIDOCAINE 4 % EX PTCH: 1.0000 | MEDICATED_PATCH | Freq: Two times a day (BID) | CUTANEOUS | 0 refills | Status: DC | PRN

## 2018-11-03 ENCOUNTER — Ambulatory Visit: Payer: Managed Care, Other (non HMO) | Attending: Neurology | Admitting: Physical Therapy

## 2018-11-03 ENCOUNTER — Other Ambulatory Visit: Payer: Self-pay

## 2018-11-03 ENCOUNTER — Encounter: Payer: Self-pay | Admitting: Physical Therapy

## 2018-11-03 DIAGNOSIS — M542 Cervicalgia: Secondary | ICD-10-CM | POA: Insufficient documentation

## 2018-11-03 DIAGNOSIS — M25572 Pain in left ankle and joints of left foot: Secondary | ICD-10-CM | POA: Diagnosis present

## 2018-11-03 DIAGNOSIS — M79652 Pain in left thigh: Secondary | ICD-10-CM | POA: Insufficient documentation

## 2018-11-03 NOTE — Therapy (Signed)
[place], [place], [address] Phone: [phone]   Fax:  [phone]  Physical Therapy Evaluation  Patient Details  Name: Benjamin Zimmerman MRN: [id number] Date of Birth: 09/07/1960 Referring Provider (PT): Melvenia Beam, MD   Encounter Date: 11/03/2018  PT End of Session - 11/03/18 1001    Visit Number  1    Number of Visits  12    Date for PT Re-Evaluation  12/15/18    Authorization Type  [employer]    PT Start Time  [phone]    PT Stop Time  0935    PT Time Calculation (min)  60 min    Activity Tolerance  Patient tolerated treatment well    Behavior During Therapy  [facility] for tasks assessed/performed       Past Medical History:  Diagnosis Date  . Arthralgia    LLE  . Kidney stones   . Plantar fasciitis    bilateral    Past Surgical History:  Procedure Laterality Date  . HERNIA REPAIR     as a child     There were no vitals filed for this visit.   Subjective Assessment - 11/03/18 0840    Subjective  Pt relays Lt leg and thigh pain for many years, pain is worse at night feels like cramping and is effecting his sleep. He also has plantar fasciitis in his Lt foot and pain is worse in the morning. He also has bilat neck and pain tightness Lt is worse than Rt. His neck pain is pretty well managed at home but does have tightness and some intermittent mild Lt UE radiculopathy. He likes to cycle and rides 14 to 20 miles a day. He does report he stretches and ices at home    Pertinent History  throid Ca but CA free for last 5 years, kidney stones    How long can you sit comfortably?  not limited    How long can you stand comfortably?  not limited    How long can you walk comfortably?  not limited    Diagnostic tests  "back MRI L3-4-5 left extra foraminal protrusion which contacts but does not compress nerve, could be source of irritation, L5-S1 annular fissure." Has a leg MRI set up soon    Patient Stated Goals   less pain and cramping at night, be able to hike [place]    Currently in Pain?  Yes    Pain Score  4     Pain Location  Foot   and Lt outer thigh and calf   Pain Orientation  Left    Pain Descriptors / Indicators  Aching;Burning;Cramping    Pain Type  Chronic pain    Pain Onset  More than a month ago    Pain Frequency  Intermittent    Aggravating Factors   being on his feet to much or first thing in morning    Pain Relieving Factors  sometimes massage, ice, heat, stretching, compression sleeve, massage gun    Multiple Pain Sites  Yes    Pain Score  2    Pain Location  Neck    Pain Orientation  Right;Left    Pain Descriptors / Indicators  Aching;Tightness    Pain Type  Chronic pain    Pain Radiating Towards  Lt arm    Pain Onset  More than a month ago    Pain Frequency  Intermittent    Aggravating Factors  turning his head too much, sometimes with prolonged desk work    Pain Relieving Factors  uses treat your own neck book by [facility] PT Assessment - 11/03/18 0001      Assessment   Medical Diagnosis  Neck pain, strain, Lt thigh, plantar fasciitis    Referring Provider (PT)  Melvenia Beam, MD    Onset Date/Surgical Date  --   chronic for last 5 years   Next MD Visit  ?    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, hiking      Cognition   Overall Cognitive Status  Within Functional Limits for tasks assessed      Observation/Other Assessments   Focus on Therapeutic Outcomes (FOTO)    30% limited      Sensation   Light Touch  Appears Intact      Coordination   Gross Motor Movements are Fluid and Coordinated  Yes      Posture/Postural Control   Posture Comments  some increased pronation but he uses good arch support insoles already      ROM / Strength   AROM / PROM / Strength  AROM;Strength      AROM   Overall AROM Comments  Rt ankle ROM WNL except excessive INV ROM    AROM Assessment Site  Ankle;Knee;Cervical    Right/Left Knee  Left    Left Knee Extension  --   WNL   Left Knee Flexion  --   WNL   Cervical Flexion  75%    Cervical Extension  WNL    Cervical - Right Side Bend  75%    Cervical - Left Side Bend  75%    Cervical - Right Rotation  WNL    Cervical - Left Rotation  WNL      Strength   Overall Strength Comments  LE strength overall WNL except for hip abd 4/5 MMT and hip ext 4+/5 MMT, ankle strength 5/5 MMT but some decreased ankle stability with SLS      Flexibility   Soft Tissue Assessment /Muscle Length  --   very tight IT band Lt, mild tightness Lt H.S and adductors     Palpation   Palpation comment  TTP with trigger points in lateral quads, TFL,IT band, calve on LT      Special Tests   Other special tests  + neural tension slump test on Lt that is neg on Rt      Ambulation/Gait   Gait Comments  some increased pronation without shoes, but good gait pattern with shoes on                Objective measurements completed on examination: See above findings.      [facility] PT Treatment/Exercise - 11/03/18 0001  Modalities   Modalities  Moist Heat      Moist Heat Therapy   Number Minutes Moist Heat  10 Minutes    Moist Heat Location  Hip;Knee             PT Education - 11/03/18 1000    Education Details  HEP, POC, DN    Person(s) Educated  Patient    Methods  Explanation;Demonstration;Verbal cues;Handout    Comprehension  Verbalized understanding;Need further instruction          PT Long Term Goals  - 11/03/18 1013      PT LONG TERM GOAL #1   Title  Pt will be I and compliant with HEP. (Target 6 weeks 12/15/18 for all goals)    Status  New      PT LONG TERM GOAL #2   Title  Pt will report overall less than 2-3/10 overall pain in neck, thigh, calf, foot with decrased cramps.    Status  New      PT LONG TERM GOAL #3   Title  Pt will improve Lt hip strength to 5/5 MMT.    Status  New             Plan - 11/03/18 1002    Clinical Impression Statement  Pt presents with chronic neck pain and tightness that is managed pretty well at home but does have some Lt UE intermittent mild radiculopaty. His biggest complaint today is his chronic Lt lateral thigh and calf pain with cramps, and chronic Lt plantar fasciitis. He also has signs of Lt IT band syndrome as this is very tight and he has pain and restrictions along it as well as significant hip abduction weakness. He is strong otherwise except for some ankle INV weakness with excessive INV ROM causing some ankle instability with SLS. It is still possible that some of his leg pain is coming from his back (see MRI in chart). He also is having a MRI coming up soon on his leg. He will benefit from skilled PT to address his defecits and reduce pain and inflation. MD has recommeded DN and PT feels this would be a great intervention for him.    Personal Factors and Comorbidities  Time since onset of injury/illness/exacerbation    Examination-Activity Limitations  Stand;Squat    Examination-Participation Restrictions  Driving;Community Activity    Stability/Clinical Decision Making  Stable/Uncomplicated    Clinical Decision Making  Low    Rehab Potential  Good    PT Frequency  2x / week    PT Duration  6 weeks    PT Treatment/Interventions  ADLs/Self Care Home Management;Cryotherapy;Electrical Stimulation;Iontophoresis 4mg /ml Dexamethasone;Moist Heat;Traction;Ultrasound;Therapeutic activities;Therapeutic exercise;Neuromuscular re-education;Manual  techniques;Taping;Passive range of motion;Dry needling;Joint Manipulations;Spinal Manipulations    PT Next Visit Plan  DN per MD recommendation, ask if he has any questions about HEP and update as needed    PT Home Exercise Plan  PF stretch, IT band stretch, calf raise eccentrics off step, Pfascia home STM, slump stretch,heat Pfascia instead of ice, SL hip abd, hip hike, neck ext SNAG and neck rotation SNAG    Consulted and Agree with Plan of Care  Patient       Patient will benefit from skilled therapeutic intervention in order to improve the following deficits and impairments:  Decreased strength, Impaired flexibility, Pain, Difficulty walking, Increased muscle spasms  Visit Diagnosis: Pain in left ankle and joints of left foot  Pain in left thigh  Cervicalgia  Problem List Patient Active Problem List   Diagnosis Date Noted  . Peroneal neuropathy at knee, left 10/14/2018  . Ganglion of flexor tendon sheath of left middle finger 04/20/2017  . Anxiety and depression 02/27/2016  . Neuropathic pain of foot, left 09/10/2015  . Morning joint stiffness 09/10/2015  . Cervical disc disorder with radiculopathy of cervical region 04/10/2015  . Floater, vitreous 02/12/2015  . Disease of thyroid gland 02/12/2015  . High myopia 02/12/2015  . Hypothyroidism, postop 08/15/2014  . Malignant neoplasm of thyroid gland ([place]) 08/15/2014  . Postoperative hypothyroidism 08/15/2014  . Plantar fasciitis of left foot 05/16/2012  . Anemia 05/16/2012  . Calculus of kidney 06/06/2011    Silvestre Mesi 11/03/2018, 10:17 AM  [facility] [address] [place], [place], [address] Phone: [phone]   Fax:  [phone]  Name: LAITH ANTONELLI MRN: [id number] Date of Birth: [birth year]

## 2018-11-03 NOTE — Patient Instructions (Signed)
Access Code: [email or web address]  URL: [email or web address]  Date: 11/03/2018  Prepared by: Elsie Ra   Exercises  Seated Plantar Fascia Stretch - 3 reps - 1 sets - 30 hold - 2x daily - 6x weekly  Seated Slump Nerve Glide - 10 reps - 1-2 sets - 3 hold - 2x daily - 6x weekly  Supine ITB Stretch with Strap - 3 reps - 1 sets - 30 hold - 2x daily - 6x weekly  Sidelying Hip Abduction on Wall - 10 reps - 3 sets - 2x daily - 6x weekly  Eccentric Heel Lowering on Step - 10 reps - 3 sets - 2x daily - 6x weekly  Hip Hiking on Step - 10 reps - 3 sets - 2x daily - 6x weekly  Seated Plantar Fascia Mobilization with Small Ball - 1 reps - 1 sets - 3 min hold - 2x daily - 6x weekly  Mid-Lower Cervical Extension SNAG with Strap - 10 reps - 3 sets - 2x daily - 6x weekly  Seated Assisted Cervical Rotation with Towel - 10 reps - 1 sets - 5 hold - 2x daily - 6x weekly

## 2018-11-08 ENCOUNTER — Encounter: Payer: Self-pay | Admitting: Physical Therapy

## 2018-11-08 ENCOUNTER — Other Ambulatory Visit: Payer: Self-pay

## 2018-11-08 ENCOUNTER — Ambulatory Visit: Payer: Managed Care, Other (non HMO) | Admitting: Physical Therapy

## 2018-11-08 DIAGNOSIS — M25572 Pain in left ankle and joints of left foot: Secondary | ICD-10-CM

## 2018-11-08 DIAGNOSIS — M542 Cervicalgia: Secondary | ICD-10-CM

## 2018-11-08 DIAGNOSIS — M79652 Pain in left thigh: Secondary | ICD-10-CM

## 2018-11-08 NOTE — Therapy (Signed)
[place], [place], [address] Phone: [phone]   Fax:  [phone]  Physical Therapy Treatment  Patient Details  Name: Benjamin Zimmerman MRN: [id number] Date of Birth: Aug 23, 1960 Referring Provider (PT): Melvenia Beam, MD   Encounter Date: 11/08/2018  PT End of Session - 11/08/18 1004    Visit Number  2    Number of Visits  12    Date for PT Re-Evaluation  12/15/18    Authorization Type  [employer]    PT Start Time  1005   heat at end   PT Stop Time  1051    PT Time Calculation (min)  46 min    Activity Tolerance  Patient tolerated treatment well       Past Medical History:  Diagnosis Date  . Arthralgia    LLE  . Kidney stones   . Plantar fasciitis    bilateral    Past Surgical History:  Procedure Laterality Date  . HERNIA REPAIR     as a child     There were no vitals filed for this visit.  Subjective Assessment - 11/08/18 1005    Subjective  Pt reports primary concern is foot cramping and lt lateral thigh pain. He has been doing his HEP starting yesterday, hip abduction ex the hardest    Currently in Pain?  Yes    Pain Score  6     Pain Location  Calf    Pain Orientation  Left    Pain Descriptors / Indicators  Cramping    Pain Type  Chronic pain    Pain Frequency  Constant    Aggravating Factors   constant cramping    Pain Relieving Factors  ice/heat/stretching    Pain Score  2    Pain Location  Hip    Pain Orientation  Left    Pain Descriptors / Indicators  Aching    Pain Type  Chronic pain    Aggravating Factors   new HEP                       [facility] Adult PT Treatment/Exercise - 11/08/18 0001      Self-Care   Self-Care  Other Self-Care Comments   DN response      Exercises   Exercises  Ankle      Moist Heat Therapy   Number Minutes Moist Heat  10 Minutes    Moist Heat Location  Other (comment)   Lt calf and lower leg     Manual Therapy   Manual  Therapy  Soft tissue mobilization    Soft tissue mobilization  skilled palpation and monitoring during DN, STM to Lt gatroc soleous, peroneals, plantar muscles.       Ankle Exercises: Stretches   Plantar Fascia Stretch  1 rep   Lt   Soleus Stretch  1 rep   Lt   Gastroc Stretch  1 rep   Lt   Other Stretch  Lt hip flexor stretches  ITB stretching      Trigger Point Dry Needling - 11/08/18 0001    Consent Given?  Yes    Education Handout Provided  No   verbally reviewed   Muscles Treated Lower Quadrant  Peroneals;Gastrocnemius;Soleus   lt   Peroneals Response  Palpable increased muscle length;Twitch response elicited    Gastrocnemius Response  Palpable increased muscle length;Twitch response elicited    Soleus Response  Twitch response elicited;Palpable  increased muscle length                PT Long Term Goals - 11/03/18 1013      PT LONG TERM GOAL #1   Title  Pt will be I and compliant with HEP. (Target 6 weeks 12/15/18 for all goals)    Status  New      PT LONG TERM GOAL #2   Title  Pt will report overall less than 2-3/10 overall pain in neck, thigh, calf, foot with decrased cramps.    Status  New      PT LONG TERM GOAL #3   Title  Pt will improve Lt hip strength to 5/5 MMT.    Status  New            Plan - 11/08/18 1044    Clinical Impression Statement  This is Mcmanaman first tx, he just started his HEP yesterday as he was busy before that.  No changes at this time. He tolerated manual work well with increased reports of movement.  He had a lot of tightness in the Lt peroneals and lateral gastroc that decreased at end of session.  He would benefit from more manual work to the Lt lateral hamstrings, TFL and Lt scalenes.    PT Frequency  2x / week    PT Duration  6 weeks    PT Treatment/Interventions  ADLs/Self Care Home Management;Cryotherapy;Electrical Stimulation;Iontophoresis 4mg /ml Dexamethasone;Moist Heat;Traction;Ultrasound;Therapeutic  activities;Therapeutic exercise;Neuromuscular re-education;Manual techniques;Taping;Passive range of motion;Dry needling;Joint Manipulations;Spinal Manipulations    PT Next Visit Plan  assess response to DN, cont to lateral Lt thigh and possibly Lt upper trap and scalenes.    Consulted and Agree with Plan of Care  Patient       Patient will benefit from skilled therapeutic intervention in order to improve the following deficits and impairments:     Visit Diagnosis: 1. Pain in left thigh   2. Cervicalgia   3. Pain in left ankle and joints of left foot        Problem List Patient Active Problem List   Diagnosis Date Noted  . Peroneal neuropathy at knee, left 10/14/2018  . Ganglion of flexor tendon sheath of left middle finger 04/20/2017  . Anxiety and depression 02/27/2016  . Neuropathic pain of foot, left 09/10/2015  . Morning joint stiffness 09/10/2015  . Cervical disc disorder with radiculopathy of cervical region 04/10/2015  . Floater, vitreous 02/12/2015  . Disease of thyroid gland 02/12/2015  . High myopia 02/12/2015  . Hypothyroidism, postop 08/15/2014  . Malignant neoplasm of thyroid gland ([place]) 08/15/2014  . Postoperative hypothyroidism 08/15/2014  . Plantar fasciitis of left foot 05/16/2012  . Anemia 05/16/2012  . Calculus of kidney 06/06/2011    Jeral Pinch PT  11/08/2018, 10:47 AM  [facility] [address] [place], [place], [address] Phone: [phone]   Fax:  [phone]  Name: SAFWAN TOMEI MRN: [id number] Date of Birth: August 10, 1960

## 2018-11-11 ENCOUNTER — Other Ambulatory Visit: Payer: Self-pay

## 2018-11-11 ENCOUNTER — Encounter: Payer: Self-pay | Admitting: Physical Therapy

## 2018-11-11 ENCOUNTER — Ambulatory Visit: Payer: Managed Care, Other (non HMO) | Admitting: Physical Therapy

## 2018-11-11 DIAGNOSIS — M25572 Pain in left ankle and joints of left foot: Secondary | ICD-10-CM | POA: Diagnosis not present

## 2018-11-11 DIAGNOSIS — M542 Cervicalgia: Secondary | ICD-10-CM

## 2018-11-11 DIAGNOSIS — M79652 Pain in left thigh: Secondary | ICD-10-CM

## 2018-11-11 NOTE — Therapy (Signed)
[place], [place], [address] Phone: [phone]   Fax:  [phone]  Physical Therapy Treatment  Patient Details  Name: Benjamin Zimmerman MRN: [id number] Date of Birth: 1961-05-10 Referring Provider (PT): Melvenia Beam, MD   Encounter Date: 11/11/2018  PT End of Session - 11/11/18 0833    Visit Number  3    Number of Visits  12    Date for PT Re-Evaluation  12/15/18    Authorization Type  [employer]    PT Start Time  7858    PT Stop Time  0929    PT Time Calculation (min)  56 min    Activity Tolerance  Patient tolerated treatment well       Past Medical History:  Diagnosis Date  . Arthralgia    LLE  . Kidney stones   . Plantar fasciitis    bilateral    Past Surgical History:  Procedure Laterality Date  . HERNIA REPAIR     as a child     There were no vitals filed for this visit.  Subjective Assessment - 11/11/18 0833    Subjective  Benjamin Zimmerman reports he feels like he is getting stronger in his hip, the exercises are still tough - hip abduction and ITB stretch. Was sore first day after DN, now feels looser, neck is sore    Currently in Pain?  Yes    Pain Score  3     Pain Location  Foot   cramping   Pain Orientation  Left                       [facility] Adult PT Treatment/Exercise - 11/11/18 0001      Moist Heat Therapy   Number Minutes Moist Heat  15 Minutes    Moist Heat Location  --   all areas treated today.      Manual Therapy   Soft tissue mobilization  skilled palpation and monitoring during DN, STM to all areas treated with DN today.  had decreased palpable tightness afterwards. Lt ant tib least tight as compared to gastroc.       Ankle Exercises: Stretches   Other Stretch  Lt hip/ITB/gastroc stretching       Trigger Point Dry Needling - 11/11/18 0001    Consent Given?  Yes    Education Handout Provided  No    Muscles Treated Head and Neck  Sternocleidomastoid;Upper  trapezius    Muscles Treated Lower Quadrant  Anterior tibialis;Hamstring   TFL Lt    Sternocleidomastoid Response  Twitch response elicited;Palpable increased muscle length   Lt   Upper Trapezius Response  Twitch reponse elicited;Palpable increased muscle length   bilat   Anterior tibialis Response  Palpable increased muscle length;Twitch response elicited   Lt    Hamstring Response  Twitch response elicited;Palpable increased muscle length   Lt lateral               PT Long Term Goals - 11/11/18 8502      PT LONG TERM GOAL #1   Title  Pt will be I and compliant with HEP. (Target 6 weeks 12/15/18 for all goals)    Status  On-going      PT LONG TERM GOAL #2   Title  Pt will report overall less than 2-3/10 overall pain in neck, thigh, calf, foot with decrased cramps.    Status  On-going  PT LONG TERM GOAL #3   Title  Pt will improve Lt hip strength to 5/5 MMT.    Status  On-going            Plan - 11/11/18 0923    Clinical Impression Statement  Benjamin Zimmerman had decreased tightness in the Lt gastroc and peroneals today compared to last visit.  He tolerated tx very well and other areas were treated today with DN and manaul work.  No goals met as this is his first week of therapy.    PT Frequency  2x / week    PT Duration  6 weeks    PT Treatment/Interventions  ADLs/Self Care Home Management;Cryotherapy;Electrical Stimulation;Iontophoresis 58m/ml Dexamethasone;Moist Heat;Traction;Ultrasound;Therapeutic activities;Therapeutic exercise;Neuromuscular re-education;Manual techniques;Taping;Passive range of motion;Dry needling;Joint Manipulations;Spinal Manipulations    PT Next Visit Plan  assess need for more DN, progress HEP to include standing glut work.       Patient will benefit from skilled therapeutic intervention in order to improve the following deficits and impairments:  Decreased strength, Impaired flexibility, Pain, Difficulty walking, Increased muscle spasms  Visit  Diagnosis: 1. Cervicalgia   2. Pain in left thigh   3. Pain in left ankle and joints of left foot        Problem List Patient Active Problem List   Diagnosis Date Noted  . Peroneal neuropathy at knee, left 10/14/2018  . Ganglion of flexor tendon sheath of left middle finger 04/20/2017  . Anxiety and depression 02/27/2016  . Neuropathic pain of foot, left 09/10/2015  . Morning joint stiffness 09/10/2015  . Cervical disc disorder with radiculopathy of cervical region 04/10/2015  . Floater, vitreous 02/12/2015  . Disease of thyroid gland 02/12/2015  . High myopia 02/12/2015  . Hypothyroidism, postop 08/15/2014  . Malignant neoplasm of thyroid gland ([place] 08/15/2014  . Postoperative hypothyroidism 08/15/2014  . Plantar fasciitis of left foot 05/16/2012  . Anemia 05/16/2012  . Calculus of kidney 06/06/2011    SManuela Benjamin Zimmerman PT  11/11/2018, 9:25 AM  [facility] [place] [address]Phone: [phone]  Fax:  [phone] Name: Benjamin HUDNALLMRN: [id number] of Birth: [birth year]

## 2018-11-15 ENCOUNTER — Encounter: Payer: Self-pay | Admitting: Physical Therapy

## 2018-11-15 ENCOUNTER — Ambulatory Visit: Payer: Managed Care, Other (non HMO) | Admitting: Physical Therapy

## 2018-11-15 ENCOUNTER — Other Ambulatory Visit: Payer: Self-pay

## 2018-11-15 DIAGNOSIS — M25572 Pain in left ankle and joints of left foot: Secondary | ICD-10-CM

## 2018-11-15 DIAGNOSIS — M542 Cervicalgia: Secondary | ICD-10-CM

## 2018-11-15 DIAGNOSIS — M79652 Pain in left thigh: Secondary | ICD-10-CM

## 2018-11-15 NOTE — Patient Instructions (Signed)
Access Code: [email or web address]  URL: [email or web address]  Date: 11/15/2018  Prepared by: Jeral Pinch   Exercises  The Diver - 10 reps - 3 sets - 1x daily  Single Leg Sit to Stand with Arms Crossed - 10 reps - 3 sets - 1x daily  Side Plank with Hip Drops - 10 reps - 2-3 sets - 1x daily   Supine over pool noodle in thoracic region.

## 2018-11-15 NOTE — Therapy (Signed)
[place], [place], [address] Phone: [phone]   Fax:  [phone]  Physical Therapy Treatment  Patient Details  Name: Benjamin Zimmerman MRN: [id number] Date of Birth: September 16, 1960 Referring Provider (PT): Melvenia Beam, MD   Encounter Date: 11/15/2018  PT End of Session - 11/15/18 0903    Visit Number  4    Number of Visits  12    Date for PT Re-Evaluation  12/15/18    Authorization Type  [employer]    PT Start Time  0903    PT Stop Time  0951    PT Time Calculation (min)  48 min    Activity Tolerance  Patient tolerated treatment well       Past Medical History:  Diagnosis Date  . Arthralgia    LLE  . Kidney stones   . Plantar fasciitis    bilateral    Past Surgical History:  Procedure Laterality Date  . HERNIA REPAIR     as a child     There were no vitals filed for this visit.  Subjective Assessment - 11/15/18 0903    Subjective  Pt reports overall he is feeling pretty good, sleeping better, however his toe feels like it may be trying to cramp this morning.     cramping in left foot 3/10 around 3rd metatarsal, not sure what brought it on this AM.    [facility] PT Assessment - 11/15/18 0001      Assessment   Medical Diagnosis  Neck pain, strain, Lt thigh, plantar fasciitis    Referring Provider (PT)  Melvenia Beam, MD      AROM   Cervical Flexion  WNL to chest     Cervical - Right Side Bend  WNL with pressure on Rt    Cervical - Left Side Bend  WNL with Rt sided tightness.       Strength   Overall Strength Comments  Rt hip abduction 5/5, Lt  5-/5      Special Tests   Other special tests  (-) neural tension Lt                    [facility] Adult PT Treatment/Exercise - 11/15/18 0001      Exercises   Exercises  Other Exercises    Other Exercises   supine lying over half bolster at thoracic level to open chest and reverese increased kypohosis      Manual Therapy   Manual  Therapy  Joint mobilization    Joint Mobilization  cervical and thoracic mobs, focus on Rt C2-5 UPA grade III pt tightest. Good mobility with CPA mobs       Ankle Exercises: Standing   SLS  with FWD leans 2x10  bilat      Ankle Exercises: Seated   Other Seated Ankle Exercises  2x10 single leg sit to stand       Ankle Exercises: Sidelying   Other Sidelying Ankle Exercises  side planks with hip drops each side.              PT Education - 11/15/18 0954    Education Details  HEP progression    Person(s) Educated  Patient    Methods  Explanation;Demonstration;Handout    Comprehension  Returned demonstration;Verbalized understanding          PT Long Term Goals - 11/15/18 0905      PT LONG TERM GOAL #1   Title  Pt will be I and compliant with HEP. (Target 6 weeks 12/15/18 for all goals)    Status  On-going      PT LONG TERM GOAL #2   Title  Pt will report overall less than 2-3/10 overall pain in neck, thigh, calf, foot with decrased cramps.    Baseline  having intermittent toe/foot cramping, others are improved.    Status  On-going      PT LONG TERM GOAL #3   Title  Pt will improve Lt hip strength to 5/5 MMT.    Status  Partially Met            Plan - 11/15/18 0908    Clinical Impression Statement  Benjamin Zimmerman is having decreased cramping in his leg and neck.  Alot of in stability with single leg stance exercises on both sides.  Making progress to goals, only second week of therapy.  On track with POC. He does have some hypomobiity in his cervical spine and increased throacic kyphosis. should do well with stretching over noodle    PT Frequency  2x / week    PT Duration  6 weeks    PT Treatment/Interventions  ADLs/Self Care Home Management;Cryotherapy;Electrical Stimulation;Iontophoresis 4m/ml Dexamethasone;Moist Heat;Traction;Ultrasound;Therapeutic activities;Therapeutic exercise;Neuromuscular re-education;Manual techniques;Taping;Passive range of motion;Dry needling;Joint  Manipulations;Spinal Manipulations    PT Next Visit Plan  possible DN to foot/peroneals, assess response to new HEP       Patient will benefit from skilled therapeutic intervention in order to improve the following deficits and impairments:  Decreased strength, Impaired flexibility, Pain, Difficulty walking, Increased muscle spasms  Visit Diagnosis: 1. Cervicalgia   2. Pain in left thigh   3. Pain in left ankle and joints of left foot        Problem List Patient Active Problem List   Diagnosis Date Noted  . Peroneal neuropathy at knee, left 10/14/2018  . Ganglion of flexor tendon sheath of left middle finger 04/20/2017  . Anxiety and depression 02/27/2016  . Neuropathic pain of foot, left 09/10/2015  . Morning joint stiffness 09/10/2015  . Cervical disc disorder with radiculopathy of cervical region 04/10/2015  . Floater, vitreous 02/12/2015  . Disease of thyroid gland 02/12/2015  . High myopia 02/12/2015  . Hypothyroidism, postop 08/15/2014  . Malignant neoplasm of thyroid gland ([facility] 08/15/2014  . Postoperative hypothyroidism 08/15/2014  . Plantar fasciitis of left foot 05/16/2012  . Anemia 05/16/2012  . Calculus of kidney 06/06/2011    SManuela Schwartzshave rPT  11/15/2018, 9:55 AM  [facility] Center180[address] DriveGBonner [place] [address]Phone: [phone]  Fax:  [phone] Name: Benjamin HARANMRN: [id number] of Birth: 7May 10, 1962

## 2018-11-18 ENCOUNTER — Encounter: Payer: Self-pay | Admitting: Physical Therapy

## 2018-11-18 ENCOUNTER — Other Ambulatory Visit: Payer: Self-pay

## 2018-11-18 ENCOUNTER — Ambulatory Visit: Payer: Managed Care, Other (non HMO) | Admitting: Physical Therapy

## 2018-11-18 DIAGNOSIS — M25572 Pain in left ankle and joints of left foot: Secondary | ICD-10-CM | POA: Diagnosis not present

## 2018-11-18 DIAGNOSIS — M79652 Pain in left thigh: Secondary | ICD-10-CM

## 2018-11-18 DIAGNOSIS — M542 Cervicalgia: Secondary | ICD-10-CM

## 2018-11-18 NOTE — Therapy (Signed)
[place], [place], [address] Phone: [phone]   Fax:  [phone]  Physical Therapy Treatment  Patient Details  Name: Benjamin Zimmerman MRN: [id number] Date of Birth: [birth year] Referring Provider (PT): Melvenia Beam, MD   Encounter Date: 11/18/2018  PT End of Session - 11/18/18 0833    Visit Number  5    Number of Visits  12    Date for PT Re-Evaluation  12/15/18    Authorization Type  [employer]    PT Start Time  1572    PT Stop Time  0925    PT Time Calculation (min)  52 min    Activity Tolerance  Patient tolerated treatment well       Past Medical History:  Diagnosis Date  . Arthralgia    LLE  . Kidney stones   . Plantar fasciitis    bilateral    Past Surgical History:  Procedure Laterality Date  . HERNIA REPAIR     as a child     There were no vitals filed for this visit.  Subjective Assessment - 11/18/18 0834    Subjective  Pt reports he has had a flare up with the new exercise, the foot has to work a lot with them.    Currently in Pain?  Yes    Pain Score  3     Pain Location  Leg    Pain Orientation  Left    Pain Descriptors / Indicators  Aching;Squeezing    Pain Type  Chronic pain    Pain Onset  More than a month ago    Pain Frequency  Constant    Aggravating Factors   new HEP    Pain Relieving Factors  ice and heat                       [facility] Adult PT Treatment/Exercise - 11/18/18 0001      Exercises   Exercises  Ankle      Manual Therapy   Manual Therapy  Joint mobilization;Soft tissue mobilization    Joint Mobilization  grade III metatarsal mobs, proximal fibular head mobs    Soft tissue mobilization  STM to all musclesDN today      Ankle Exercises: Stretches   Other Stretch  Lt ITB stretching with band, dorsal foot streteches.        Trigger Point Dry Needling - 11/18/18 0001    Consent Given?  Yes    Education Handout Provided  Previously  provided   all left   Muscles Treated Lower Quadrant  Quadriceps;Vastus lateralis   extensor digitorum, extensor hallicis, plantar quadratus,    Electrical Stimulation Performed with Dry Needling  Yes    Quadriceps Response  Palpable increased muscle length;Twitch response elicited    Vastus lateralis Response  Palpable increased muscle length;Twitch response elicited    Flexor digitorum longus Response  Palpable increased muscle length;Twitch response elicited                PT Long Term Goals - 11/15/18 0905      PT LONG TERM GOAL #1   Title  Pt will be I and compliant with HEP. (Target 6 weeks 12/15/18 for all goals)    Status  On-going      PT LONG TERM GOAL #2   Title  Pt will report overall less than 2-3/10 overall pain in neck, thigh, calf, foot with decrased cramps.  Baseline  having intermittent toe/foot cramping, others are improved.    Status  On-going      PT LONG TERM GOAL #3   Title  Pt will improve Lt hip strength to 5/5 MMT.    Status  Partially Met            Plan - 11/18/18 0931    Clinical Impression Statement  Benjamin Zimmerman had a flare up of pain with progression of new HEP. Recommend he rest over the weekend and return to new exercise on Monday.  Tolerated stim with DN and manual work well today with good releases througth the Lt foot and lateral leg.    PT Frequency  2x / week    PT Duration  6 weeks    PT Treatment/Interventions  ADLs/Self Care Home Management;Cryotherapy;Electrical Stimulation;Iontophoresis 45m/ml Dexamethasone;Moist Heat;Traction;Ultrasound;Therapeutic activities;Therapeutic exercise;Neuromuscular re-education;Manual techniques;Taping;Passive range of motion;Dry needling;Joint Manipulations;Spinal Manipulations    PT Next Visit Plan  possible DN to foot/peroneals, assess response to new HEP    Consulted and Agree with Plan of Care  Patient       Patient will benefit from skilled therapeutic intervention in order to improve the  following deficits and impairments:     Visit Diagnosis: 1. Pain in left ankle and joints of left foot   2. Pain in left thigh   3. Cervicalgia        Problem List Patient Active Problem List   Diagnosis Date Noted  . Peroneal neuropathy at knee, left 10/14/2018  . Ganglion of flexor tendon sheath of left middle finger 04/20/2017  . Anxiety and depression 02/27/2016  . Neuropathic pain of foot, left 09/10/2015  . Morning joint stiffness 09/10/2015  . Cervical disc disorder with radiculopathy of cervical region 04/10/2015  . Floater, vitreous 02/12/2015  . Disease of thyroid gland 02/12/2015  . High myopia 02/12/2015  . Hypothyroidism, postop 08/15/2014  . Malignant neoplasm of thyroid gland ([facility] 08/15/2014  . Postoperative hypothyroidism 08/15/2014  . Plantar fasciitis of left foot 05/16/2012  . Anemia 05/16/2012  . Calculus of kidney 06/06/2011    SJeral PinchPT  11/18/2018, 9:32 AM  CD[facility]GMis[place] [address]Phone: [phone]  Fax:  [phone] Name: Benjamin KERSTENMRN: [id number] of Birth: [birth year]

## 2018-11-22 ENCOUNTER — Ambulatory Visit: Payer: Managed Care, Other (non HMO) | Admitting: Physical Therapy

## 2018-11-22 ENCOUNTER — Encounter

## 2018-11-24 ENCOUNTER — Ambulatory Visit: Payer: Managed Care, Other (non HMO) | Attending: Neurology | Admitting: Physical Therapy

## 2018-11-24 ENCOUNTER — Other Ambulatory Visit: Payer: Self-pay

## 2018-11-24 ENCOUNTER — Encounter: Payer: Self-pay | Admitting: Physical Therapy

## 2018-11-24 DIAGNOSIS — M542 Cervicalgia: Secondary | ICD-10-CM | POA: Diagnosis present

## 2018-11-24 DIAGNOSIS — M25572 Pain in left ankle and joints of left foot: Secondary | ICD-10-CM | POA: Insufficient documentation

## 2018-11-24 DIAGNOSIS — M79652 Pain in left thigh: Secondary | ICD-10-CM | POA: Insufficient documentation

## 2018-11-24 NOTE — Therapy (Signed)
[place], [place], [address] Phone: [phone]   Fax:  [phone]  Physical Therapy Treatment  Patient Details  Name: Benjamin Zimmerman MRN: [id number] Date of Birth: 1961-05-17 Referring Provider (PT): Melvenia Beam, MD   Encounter Date: 11/24/2018  PT End of Session - 11/24/18 1005    Visit Number  6    Number of Visits  12    Date for PT Re-Evaluation  12/15/18    PT Start Time  1005    PT Stop Time  1053    PT Time Calculation (min)  48 min    Activity Tolerance  Patient tolerated treatment well    Behavior During Therapy  [facility] for tasks assessed/performed       Past Medical History:  Diagnosis Date  . Arthralgia    LLE  . Kidney stones   . Plantar fasciitis    bilateral    Past Surgical History:  Procedure Laterality Date  . HERNIA REPAIR     as a child     There were no vitals filed for this visit.  Subjective Assessment - 11/24/18 1008    Subjective  " I have been working pretty hard to get the hip stronger, and decreased pain in the hip. My calf is more cramped, overall its better"    Currently in Pain?  Yes    Pain Score  3     Pain Location  Leg    Pain Orientation  Left    Pain Descriptors / Indicators  Aching;Sore    Pain Onset  More than a month ago    Pain Frequency  Constant    Pain Score  2    Pain Location  Hip    Pain Orientation  Left    Pain Descriptors / Indicators  Aching    Pain Type  Chronic pain    Aggravating Factors   HEP                       [facility] Adult PT Treatment/Exercise - 11/24/18 0001      Manual Therapy   Manual therapy comments  skilled palpation and monitoring of pt througout TPDN    Joint Mobilization  grade III intertarsal/ metatarsal mobs, proximal fibular head mobs grade III PA/ AP , Grade V  talocrural manip    Soft tissue mobilization  IASTM along fibularis tertius/ extensor digitorum      Ankle Exercises: Seated   Other Seated Ankle Exercises  toe yoga 1 x 10      Ankle Exercises: Stretches   Plantar Fascia Stretch  1 rep;30 seconds    Soleus Stretch  1 rep;30 seconds    Gastroc Stretch  30 seconds;1 rep    Other Stretch  anterior tib stretch, tertius stretch 2 x 30      Ankle Exercises: Standing   Heel Raises  Left;15 reps   with metatarsal heads on rolled towel x 2 sets      Trigger Point Dry Needling - 11/24/18 0001    Consent Given?  Yes    Education Handout Provided  Previously provided    Muscles Treated Lower Quadrant  Posterior tibialis    Vastus lateralis Response  Twitch response elicited;Palpable increased muscle length    Peroneals Response  Palpable increased muscle length;Twitch response elicited    Posterior tibialis Response  Twitch response elicited;Palpable increased muscle length    Gastrocnemius Response  Palpable  increased muscle length;Twitch response elicited    Flexor digitorum longus Response  Palpable increased muscle length;Twitch response elicited           PT Education - 11/24/18 1136    Education Details  reviewed HEP and gradual progress with single leg stance exercises. standing anterior tib stretching    Person(s) Educated  Patient    Methods  Explanation;Verbal cues;Handout    Comprehension  Verbalized understanding;Verbal cues required          PT Long Term Goals - 11/15/18 0905      PT LONG TERM GOAL #1   Title  Pt will be I and compliant with HEP. (Target 6 weeks 12/15/18 for all goals)    Status  On-going      PT LONG TERM GOAL #2   Title  Pt will report overall less than 2-3/10 overall pain in neck, thigh, calf, foot with decrased cramps.    Baseline  having intermittent toe/foot cramping, others are improved.    Status  On-going      PT LONG TERM GOAL #3   Title  Pt will improve Lt hip strength to 5/5 MMT.    Status  Partially Met            Plan - 11/24/18 1137    Clinical Impression Statement  pt reports no flare since  previous visit. continued to report soreness located along the anterolateral aspect of the foot along the extensor digitorum/ peroneal tertius. continued TPDN followed with ankle and proximal tibiofibular mobs. reviewed exercises and benfits of gradually increasing reps to promote accomodation. pt reported continued stiffness in the foot end of session as well as need to self manipulate the his MTP joints.    PT Treatment/Interventions  ADLs/Self Care Home Management;Cryotherapy;Electrical Stimulation;Iontophoresis 20m/ml Dexamethasone;Moist Heat;Traction;Ultrasound;Therapeutic activities;Therapeutic exercise;Neuromuscular re-education;Manual techniques;Taping;Passive range of motion;Dry needling;Joint Manipulations;Spinal Manipulations    PT Next Visit Plan  possible DN to foot/peroneals, hows progression of HEP, balance and stability,    PT Home Exercise Plan  PF stretch, IT band stretch, calf raise eccentrics off step, Pfascia home STM, heat Pfascia instead of ice, SL hip abd, hip hike, neck ext SNAG and neck rotation SNAG, standing tibialis anterior stretch       Patient will benefit from skilled therapeutic intervention in order to improve the following deficits and impairments:     Visit Diagnosis: 1. Pain in left ankle and joints of left foot   2. Pain in left thigh   3. Cervicalgia        Problem List Patient Active Problem List   Diagnosis Date Noted  . Peroneal neuropathy at knee, left 10/14/2018  . Ganglion of flexor tendon sheath of left middle finger 04/20/2017  . Anxiety and depression 02/27/2016  . Neuropathic pain of foot, left 09/10/2015  . Morning joint stiffness 09/10/2015  . Cervical disc disorder with radiculopathy of cervical region 04/10/2015  . Floater, vitreous 02/12/2015  . Disease of thyroid gland 02/12/2015  . High myopia 02/12/2015  . Hypothyroidism, postop 08/15/2014  . Malignant neoplasm of thyroid gland ([facility] 08/15/2014  . Postoperative hypothyroidism  08/15/2014  . Plantar fasciitis of left foot 05/16/2012  . Anemia 05/16/2012  . Calculus of kidney 06/06/2011   KStarr LakePT, DPT, LAT, ATC  11/24/18  11:44 AM      CG[facility]vesC[facility] Inc[address] [address]Phone: [phone]  Fax:  [phone] Name: Benjamin SENAMRN: [id number]  of Birth: March 22, 1961

## 2018-11-29 ENCOUNTER — Ambulatory Visit: Payer: Managed Care, Other (non HMO) | Admitting: Physical Therapy

## 2018-11-29 ENCOUNTER — Encounter: Payer: Managed Care, Other (non HMO) | Admitting: Physical Therapy

## 2018-12-02 ENCOUNTER — Ambulatory Visit: Payer: Managed Care, Other (non HMO) | Admitting: Physical Therapy

## 2018-12-02 ENCOUNTER — Encounter: Payer: Managed Care, Other (non HMO) | Admitting: Physical Therapy

## 2018-12-06 ENCOUNTER — Encounter: Payer: Managed Care, Other (non HMO) | Admitting: Physical Therapy

## 2018-12-06 ENCOUNTER — Ambulatory Visit: Payer: Managed Care, Other (non HMO) | Admitting: Physical Therapy

## 2018-12-06 ENCOUNTER — Other Ambulatory Visit: Payer: Self-pay

## 2018-12-06 ENCOUNTER — Ambulatory Visit
Admission: RE | Admit: 2018-12-06 | Discharge: 2018-12-06 | Disposition: A | Discharge status: Home / Self Care | Payer: Managed Care, Other (non HMO) | Source: Ambulatory Visit | Attending: Neurology | Admitting: Neurology

## 2018-12-06 ENCOUNTER — Encounter: Payer: Self-pay | Admitting: Physical Therapy

## 2018-12-06 DIAGNOSIS — M542 Cervicalgia: Secondary | ICD-10-CM

## 2018-12-06 DIAGNOSIS — G5732 Lesion of lateral popliteal nerve, left lower limb: Secondary | ICD-10-CM

## 2018-12-06 DIAGNOSIS — M25572 Pain in left ankle and joints of left foot: Secondary | ICD-10-CM | POA: Diagnosis not present

## 2018-12-06 DIAGNOSIS — M79652 Pain in left thigh: Secondary | ICD-10-CM

## 2018-12-06 NOTE — Therapy (Signed)
[facility] [address] [place], [place], [address] Phone: [phone]   Fax:  [phone]  Physical Therapy Treatment  Patient Details  Name: Benjamin Zimmerman MRN: [id number] Date of Birth: 01/13/1961 Referring Provider (PT): Ahern, Antonia B, MD   Encounter Date: 12/06/2018  PT End of Session - 12/06/18 0938    Visit Number  7    Number of Visits  12    Date for PT Re-Evaluation  12/15/18    Authorization Type  [employer]    PT Start Time  0936    PT Stop Time  1019    PT Time Calculation (min)  43 min    Activity Tolerance  Patient tolerated treatment well    Behavior During Therapy  WFL for tasks assessed/performed       Past Medical History:  Diagnosis Date  . Arthralgia    LLE  . Kidney stones   . Plantar fasciitis    bilateral    Past Surgical History:  Procedure Laterality Date  . HERNIA REPAIR     as a child     There were no vitals filed for this visit.  Subjective Assessment - 12/06/18 0940    Subjective  Maybe feeling like I pop my toes a little less. My IT band is b othering me just as much today.    Patient Stated Goals  less pain and cramping at night, be able to [place]    Currently in Pain?  Yes    Pain Score  3     Pain Location  Leg    Pain Orientation  Left;Lateral    Pain Descriptors / Indicators  --   cramping                      [facility] Adult PT Treatment/Exercise - 12/06/18 0001      Exercises   Exercises  Knee/Hip      Knee/Hip Exercises: Supine   Bridges  15 reps    Bridges Limitations  green tband    Other Supine Knee/Hip Exercises  bridge with marching green tband      Manual Therapy   Manual therapy comments  skilled palpation and monitoring of pt througout TPDN    Soft tissue mobilization  Lt glut med/min/TFL, Lt ant tib      Ankle Exercises: Seated   Other Seated Ankle Exercises  heel raises, ball bw ankles    Other Seated Ankle Exercises   eversion seated, yellow tband; arch exercise       Trigger Point Dry Needling - 12/06/18 0001    Muscles Treated Back/Hip  Gluteus medius;Tensor fascia lata    Anterior tibialis Response  Twitch response elicited;Palpable increased muscle length   Left   Gluteus Medius Response  Twitch response elicited;Palpable increased muscle length   LEft   Tensor Fascia Lata Response  Twitch response elicited;Palpable increased muscle length   Left               PT Long Term Goals - 11/15/18 0905      PT LONG TERM GOAL #1   Title  Pt will be I and compliant with HEP. (Target 6 weeks 12/15/18 for all goals)    Status  On-going      PT LONG TERM GOAL #2   Title  Pt will report overall less than 2-3/10 overall pain in neck, thigh, calf, foot with decrased cramps.      Baseline  having intermittent toe/foot cramping, others are improved.    Status  On-going      PT LONG TERM GOAL #3   Title  Pt will improve Lt hip strength to 5/5 MMT.    Status  Partially Met            Plan - 12/06/18 1020    Clinical Impression Statement  Significant mobility in feet/ankles as well as proximal weakness contributing to distal instability. Twitch response with DN to Lt glut med & TFL.    PT Treatment/Interventions  ADLs/Self Care Home Management;Cryotherapy;Electrical Stimulation;Iontophoresis 4mg/ml Dexamethasone;Moist Heat;Traction;Ultrasound;Therapeutic activities;Therapeutic exercise;Neuromuscular re-education;Manual techniques;Taping;Passive range of motion;Dry needling;Joint Manipulations;Spinal Manipulations    PT Next Visit Plan  continue proximal stability, standing heel raises ball bw ankles    PT Home Exercise Plan  PF stretch, IT band stretch, calf raise eccentrics off step, Pfascia home STM, heat Pfascia instead of ice, SL hip abd, hip hike, neck ext SNAG and neck rotation SNAG, standing tibialis anterior stretch; clam, bridge, bridge+march, heel raises with ball, arch lifts    Consulted  and Agree with Plan of Care  Patient       Patient will benefit from skilled therapeutic intervention in order to improve the following deficits and impairments:  Decreased strength, Impaired flexibility, Pain, Difficulty walking, Increased muscle spasms  Visit Diagnosis: 1. Pain in left ankle and joints of left foot   2. Pain in left thigh   3. Cervicalgia        Problem List Patient Active Problem List   Diagnosis Date Noted  . Peroneal neuropathy at knee, left 10/14/2018  . Ganglion of flexor tendon sheath of left middle finger 04/20/2017  . Anxiety and depression 02/27/2016  . Neuropathic pain of foot, left 09/10/2015  . Morning joint stiffness 09/10/2015  . Cervical disc disorder with radiculopathy of cervical region 04/10/2015  . Floater, vitreous 02/12/2015  . Disease of thyroid gland 02/12/2015  . High myopia 02/12/2015  . Hypothyroidism, postop 08/15/2014  . Malignant neoplasm of thyroid gland ([facility]) 08/15/2014  . Postoperative hypothyroidism 08/15/2014  . Plantar fasciitis of left foot 05/16/2012  . Anemia 05/16/2012  . Calculus of kidney 06/06/2011    Jessica C. Hightower PT, DPT 12/06/18 10:24 AM   [facility] [address] [place], [place], [address] Phone: [phone]   Fax:  [phone]  Name: Benjamin Zimmerman MRN: [id number] Date of Birth: 02/01/1961   

## 2018-12-09 ENCOUNTER — Encounter: Payer: Self-pay | Admitting: Physical Therapy

## 2018-12-09 ENCOUNTER — Encounter: Payer: Managed Care, Other (non HMO) | Admitting: Physical Therapy

## 2018-12-09 ENCOUNTER — Other Ambulatory Visit: Payer: Self-pay

## 2018-12-09 ENCOUNTER — Ambulatory Visit: Payer: Managed Care, Other (non HMO) | Admitting: Physical Therapy

## 2018-12-09 DIAGNOSIS — M79652 Pain in left thigh: Secondary | ICD-10-CM

## 2018-12-09 DIAGNOSIS — M25572 Pain in left ankle and joints of left foot: Secondary | ICD-10-CM

## 2018-12-09 DIAGNOSIS — M542 Cervicalgia: Secondary | ICD-10-CM

## 2018-12-09 NOTE — Patient Instructions (Signed)
  Top: stay in side plank, resisted adduction from band in door

## 2018-12-09 NOTE — Therapy (Signed)
[place], [place], [address] Phone: [phone]   Fax:  [phone]  Physical Therapy Treatment  Patient Details  Name: Benjamin Zimmerman MRN: [id number] Date of Birth: 02-06-1961 Referring Provider (PT): Melvenia Beam, MD   Encounter Date: 12/09/2018  PT End of Session - 12/09/18 1017    Visit Number  8    Number of Visits  12    Date for PT Re-Evaluation  12/15/18    Authorization Type  [employer]    PT Start Time  0807    PT Stop Time  0857    PT Time Calculation (min)  50 min    Activity Tolerance  Patient tolerated treatment well    Behavior During Therapy  [facility] for tasks assessed/performed       Past Medical History:  Diagnosis Date  . Arthralgia    LLE  . Kidney stones   . Plantar fasciitis    bilateral    Past Surgical History:  Procedure Laterality Date  . HERNIA REPAIR     as a child     There were no vitals filed for this visit.  Subjective Assessment - 12/09/18 0808    Subjective  Left ischial tuberosity left compression in seat where Right did not. This morning the side of Lt thigh is sore and tight.    Diagnostic tests  "back MRI L3-4-5 left extra foraminal protrusion which contacts but does not compress nerve, could be source of irritation, L5-S1 annular fissure." Has a leg MRI set up soon    Patient Stated Goals  less pain and cramping at night, [place]    Currently in Pain?  Yes    Pain Score  3     Pain Location  Leg    Pain Orientation  Left    Pain Descriptors / Indicators  Sore         [facility] PT Assessment - 12/09/18 0001      Palpation   Palpation comment  apparent Lt innominate outflare                   [facility] Adult PT Treatment/Exercise - 12/09/18 0001      Pilates   Pilates Tower  supine circles yellow spring, side plank+adduction    Other Pilates  abdominal engagment, rib cage depression             PT  Education - 12/09/18 1022    Education Details  see plan          PT Long Term Goals - 11/15/18 0905      PT LONG TERM GOAL #1   Title  Pt will be I and compliant with HEP. (Target 6 weeks 12/15/18 for all goals)    Status  On-going      PT LONG TERM GOAL #2   Title  Pt will report overall less than 2-3/10 overall pain in neck, thigh, calf, foot with decrased cramps.    Baseline  having intermittent toe/foot cramping, others are improved.    Status  On-going      PT LONG TERM GOAL #3   Title  Pt will improve Lt hip strength to 5/5 MMT.    Status  Partially Met            Plan - 12/09/18 1018    Clinical Impression Statement  Pt felt that DN helped for a short time but overall  did not leave a lasting change. time taken today to re-evaluate postures and strengths/weaknesses. Notable outflare of Lt innominate contributing to weakness in abductors as well as ITB tightness with lateral quad tightness. Rib cage flare present so significant time spent today teaching patient to properly engage core which was difficult. Condensed HEP today to improve focus and challenge core engagement. Will alter POC to 1/week at this time.    PT Treatment/Interventions  ADLs/Self Care Home Management;Cryotherapy;Electrical Stimulation;Iontophoresis 2m/ml Dexamethasone;Moist Heat;Traction;Ultrasound;Therapeutic activities;Therapeutic exercise;Neuromuscular re-education;Manual techniques;Taping;Passive range of motion;Dry needling;Joint Manipulations;Spinal Manipulations    PT Next Visit Plan  ERO    PT Home Exercise Plan  bridge with ball, ITB stretch, Rt side plank with resisted adduction, abdominal engagement (try on bike)    Consulted and Agree with Plan of Care  Patient       Patient will benefit from skilled therapeutic intervention in order to improve the following deficits and impairments:  Decreased strength, Impaired flexibility, Pain, Difficulty walking, Increased muscle spasms  Visit  Diagnosis: 1. Pain in left ankle and joints of left foot   2. Pain in left thigh   3. Cervicalgia        Problem List Patient Active Problem List   Diagnosis Date Noted  . Peroneal neuropathy at knee, left 10/14/2018  . Ganglion of flexor tendon sheath of left middle finger 04/20/2017  . Anxiety and depression 02/27/2016  . Neuropathic pain of foot, left 09/10/2015  . Morning joint stiffness 09/10/2015  . Cervical disc disorder with radiculopathy of cervical region 04/10/2015  . Floater, vitreous 02/12/2015  . Disease of thyroid gland 02/12/2015  . High myopia 02/12/2015  . Hypothyroidism, postop 08/15/2014  . Malignant neoplasm of thyroid gland ([place] 08/15/2014  . Postoperative hypothyroidism 08/15/2014  . Plantar fasciitis of left foot 05/16/2012  . Anemia 05/16/2012  . Calculus of kidney 06/06/2011    Jessica C. Hightower PT, DPT 12/09/18 10:25 AM   [facility] [address]Phone: [phone]  Fax:  [phone] Name: Benjamin COUEYMRN: [id number] of Birth: [birth year]

## 2018-12-13 ENCOUNTER — Ambulatory Visit: Payer: Managed Care, Other (non HMO) | Admitting: Physical Therapy

## 2018-12-16 ENCOUNTER — Encounter: Payer: Self-pay | Admitting: Physical Therapy

## 2018-12-16 ENCOUNTER — Other Ambulatory Visit: Payer: Self-pay

## 2018-12-16 ENCOUNTER — Ambulatory Visit: Payer: Managed Care, Other (non HMO) | Admitting: Physical Therapy

## 2018-12-16 DIAGNOSIS — M25572 Pain in left ankle and joints of left foot: Secondary | ICD-10-CM | POA: Diagnosis not present

## 2018-12-16 DIAGNOSIS — M542 Cervicalgia: Secondary | ICD-10-CM

## 2018-12-16 DIAGNOSIS — M79652 Pain in left thigh: Secondary | ICD-10-CM

## 2018-12-16 NOTE — Therapy (Addendum)
[place], [place], [address] Phone: [phone]   Fax:  [phone]  Physical Therapy Treatment  Patient Details  Name: Benjamin Zimmerman MRN: [id number] Date of Birth: May 14, 1961 Referring Provider (PT): Melvenia Beam, MD   Encounter Date: 12/16/2018  PT End of Session - 12/16/18 0808    Visit Number  9    Number of Visits  12    Authorization Type  [employer]    PT Start Time  0801     PT end time:845 Total treatment time 44 min  Past Medical History:  Diagnosis Date  . Arthralgia    LLE  . Kidney stones   . Plantar fasciitis    bilateral    Past Surgical History:  Procedure Laterality Date  . HERNIA REPAIR     as a child     There were no vitals filed for this visit.  Subjective Assessment - 12/16/18 0804    Subjective  My massage therapist thinks I should have my right leg needled as well. Lt greater throch pain in side planks with hip abduction.    Patient Stated Goals  less pain and cramping at night, be able [place]         [facility] PT Assessment - 12/16/18 0001      ROM / Strength   AROM / PROM / Strength  Strength      Strength   Overall Strength Comments  otherwise 5/5    Strength Assessment Site  Hip    Right/Left Hip  Left    Left Hip Flexion  4+/5    Left Hip ABduction  4/5             Pilates Tower for LE/Core strength, postural strength, lumbopelvic disassociation and core control.  Exercises included: Supine Leg Springs- diagonals yellow spring Sidelying Leg Springs- adduction yellow spring Primal push up sidelying adduction clam                      PT Long Term Goals - 12/16/18 0809      PT LONG TERM GOAL #1   Title  Pt will be I and compliant with HEP. (Target 6 weeks 12/15/18 for all goals)      PT LONG TERM GOAL #2   Title  Pt will report overall less than 2-3/10 overall pain in neck, thigh, calf, foot with  decrased cramps.    Baseline  3/10, still cramping on top of foot; I don't feel like I have to pop my toes as often      PT LONG TERM GOAL #3   Title  Pt will improve Lt hip strength to 5/5 MMT.    Baseline  see flowsheet         continued Lt innominate outlfare contributing to lateral LE tightness and compression at greater trochanter under ITB. removed side plank from adduction motion to decrease pain in lateral hip. Pt demo significant improvement in ability to engage abdominals correctly. Will extend POC to 1/week at this time as pt continues to require skilled PT to retrain biomechanical activation. Significant twitching in vastus lateralis resulting in soreness as expected.      Patient will benefit from skilled therapeutic intervention in order to improve the following deficits and impairments:    Decreased strength; Impaired flexibility; Pain; Difficulty walking; Increased muscle spasms  Visit Diagnosis: M25.572 M79.652 M54.2  Treatment time: TherEx 35  Manual Therapy 8   Problem List Patient Active Problem List   Diagnosis Date Noted  . Peroneal neuropathy at knee, left 10/14/2018  . Ganglion of flexor tendon sheath of left middle finger 04/20/2017  . Anxiety and depression 02/27/2016  . Neuropathic pain of foot, left 09/10/2015  . Morning joint stiffness 09/10/2015  . Cervical disc disorder with radiculopathy of cervical region 04/10/2015  . Floater, vitreous 02/12/2015  . Disease of thyroid gland 02/12/2015  . High myopia 02/12/2015  . Hypothyroidism, postop 08/15/2014  . Malignant neoplasm of thyroid gland ([place]) 08/15/2014  . Postoperative hypothyroidism 08/15/2014  . Plantar fasciitis of left foot 05/16/2012  . Anemia 05/16/2012  . Calculus of kidney 06/06/2011   Benjamin Zimmerman PT, DPT 12/21/18 10:24 AM   [place] [facility] [address] [place], [place], [address] Phone: [phone]   Fax:   [phone]  Name: Benjamin Zimmerman MRN: [id number] Date of Birth: 1961/04/03

## 2018-12-21 ENCOUNTER — Encounter: Payer: Self-pay | Admitting: Physical Therapy

## 2018-12-21 ENCOUNTER — Ambulatory Visit: Payer: Managed Care, Other (non HMO) | Admitting: Physical Therapy

## 2018-12-21 ENCOUNTER — Other Ambulatory Visit: Payer: Self-pay

## 2018-12-21 DIAGNOSIS — M25572 Pain in left ankle and joints of left foot: Secondary | ICD-10-CM | POA: Diagnosis not present

## 2018-12-21 DIAGNOSIS — M79652 Pain in left thigh: Secondary | ICD-10-CM

## 2018-12-21 DIAGNOSIS — M542 Cervicalgia: Secondary | ICD-10-CM

## 2018-12-21 NOTE — Therapy (Signed)
[place], [place], [address] Phone: [phone]   Fax:  [phone]  Physical Therapy Treatment  Patient Details  Name: Benjamin Zimmerman MRN: [id number] Date of Birth: [birth year] Referring Provider (PT): Melvenia Beam, MD   Encounter Date: 12/21/2018  PT End of Session - 12/21/18 0816    Visit Number  10    Number of Visits  15    Date for PT Re-Evaluation  01/27/19    Authorization Type  [employer]ign[employer]    PT Start Time  0815    PT Stop Time  0904    PT Time Calculation (min)  49 min    Activity Tolerance  Patient tolerated treatment well    Behavior During Therapy  [facility] for tasks assessed/performed       Past Medical History:  Diagnosis Date  . Arthralgia    LLE  . Kidney stones   . Plantar fasciitis    bilateral    Past Surgical History:  Procedure Laterality Date  . HERNIA REPAIR     as a child     There were no vitals filed for this visit.  Subjective Assessment - 12/21/18 0816    Subjective  Still feels pretty tight through lateral thigh. seemed like My Rt foot bothered me more yesterday. Lateral leg and foot still feels very strained    Patient Stated Goals  less pain and cramping at night, be able [place]    Currently in Pain?  Yes    Pain Score  3     Pain Location  Leg    Pain Orientation  Left    Pain Descriptors / Indicators  --   strained                      [facility] Adult PT Treatment/Exercise - 12/21/18 0001      Knee/Hip Exercises: Supine   Other Supine Knee/Hip Exercises  tabletop lifts with ball bw knees      Knee/Hip Exercises: Prone   Other Prone Exercises  QPED adduction to press up with knee flexed    Other Prone Exercises  primal push ups with ball squeeze bw knees      Manual Therapy   Manual therapy comments  skilled palpation and monitoring of pt througout TPDN, manual stretching hip & Lt LE    Soft tissue mobilization  all  sites of TPDN       Trigger Point Dry Needling - 12/21/18 0001    Muscles Treated Back/Hip  Gluteus medius;Piriformis    Quadriceps Response  Palpable increased muscle length;Twitch response elicited    Anterior tibialis Response  Palpable increased muscle length;Twitch response elicited    Hamstring Response  Palpable increased muscle length;Twitch response elicited    Soleus Response  Palpable increased muscle length;Twitch response elicited    Gluteus Medius Response  Twitch response elicited;Palpable increased muscle length    Piriformis Response  Twitch response elicited;Palpable increased muscle length    Tensor Fascia Lata Response  Palpable increased muscle length;Twitch response elicited           PT Education - 12/21/18 0907    Education Details  see plan    Person(s) Educated  Patient    Methods  Explanation    Comprehension  Verbalized understanding          PT Long Term Goals - 12/16/18 0809      PT LONG  TERM GOAL #1   Title  Pt will be I and compliant with HEP. (Target 6 weeks 12/15/18 for all goals)      PT LONG TERM GOAL #2   Title  Pt will report overall less than 2-3/10 overall pain in neck, thigh, calf, foot with decrased cramps.    Baseline  3/10, still cramping on top of foot; I don't feel like I have to pop my toes as often      PT LONG TERM GOAL #3   Title  Pt will improve Lt hip strength to 5/5 MMT.    Baseline  see flowsheet            Plan - 12/21/18 0907    Clinical Impression Statement  Pt is frustrated with continued symtpoms of strain down his leg. We discussed how hypermobility can contribute to increased cramping and pressures of shoes through foot. He reports he has to use a shoe stretcher for lateral toe box of leather shoes and feels better when he has a narrow heel bed. DN down lateral aspect of Lt LE to decrease pull on outflare of innominate. anterior tib discomfort in primal push ups so we put his heels against the wall for  stability.    PT Treatment/Interventions  ADLs/Self Care Home Management;Cryotherapy;Electrical Stimulation;Iontophoresis 4mg /ml Dexamethasone;Moist Heat;Traction;Ultrasound;Therapeutic activities;Therapeutic exercise;Neuromuscular re-education;Manual techniques;Taping;Passive range of motion;Dry needling;Joint Manipulations;Spinal Manipulations    PT Next Visit Plan  standing balance work    PT Home Exercise Plan  bridge with ball, ITB stretch, Lt resisted adduction, abdominal engagement (try on bike), primal push up, eccentric heel raises    Consulted and Agree with Plan of Care  Patient       Patient will benefit from skilled therapeutic intervention in order to improve the following deficits and impairments:  Decreased strength, Impaired flexibility, Pain, Difficulty walking, Increased muscle spasms  Visit Diagnosis: 1. Pain in left ankle and joints of left foot   2. Pain in left thigh   3. Cervicalgia        Problem List Patient Active Problem List   Diagnosis Date Noted  . Peroneal neuropathy at knee, left 10/14/2018  . Ganglion of flexor tendon sheath of left middle finger 04/20/2017  . Anxiety and depression 02/27/2016  . Neuropathic pain of foot, left 09/10/2015  . Morning joint stiffness 09/10/2015  . Cervical disc disorder with radiculopathy of cervical region 04/10/2015  . Floater, vitreous 02/12/2015  . Disease of thyroid gland 02/12/2015  . High myopia 02/12/2015  . Hypothyroidism, postop 08/15/2014  . Malignant neoplasm of thyroid gland ([place]) 08/15/2014  . Postoperative hypothyroidism 08/15/2014  . Plantar fasciitis of left foot 05/16/2012  . Anemia 05/16/2012  . Calculus of kidney 06/06/2011   Nishka Heide C. Nieko Clarin PT, DPT 12/21/18 10:07 AM   [place] [facility] [address] [place], [place], [address] Phone: [phone]   Fax:  [phone]  Name: Benjamin Zimmerman MRN: [id number] Date of Birth: 09/25/1960

## 2018-12-29 ENCOUNTER — Encounter: Payer: Self-pay | Admitting: Physical Therapy

## 2018-12-29 ENCOUNTER — Ambulatory Visit: Payer: Managed Care, Other (non HMO) | Attending: Neurology | Admitting: Physical Therapy

## 2018-12-29 ENCOUNTER — Other Ambulatory Visit: Payer: Self-pay

## 2018-12-29 DIAGNOSIS — M542 Cervicalgia: Secondary | ICD-10-CM | POA: Diagnosis present

## 2018-12-29 DIAGNOSIS — M79652 Pain in left thigh: Secondary | ICD-10-CM

## 2018-12-29 DIAGNOSIS — M25572 Pain in left ankle and joints of left foot: Secondary | ICD-10-CM | POA: Diagnosis present

## 2018-12-29 NOTE — Therapy (Signed)
[place], [place], [address] Phone: [phone]   Fax:  [phone]  Physical Therapy Treatment  Patient Details  Name: Benjamin Zimmerman MRN: [id number] Date of Birth: 20-Oct-1960 Referring Provider (PT): Melvenia Beam, MD   Encounter Date: 12/29/2018  PT End of Session - 12/29/18 1318    Visit Number  11    Number of Visits  15    Date for PT Re-Evaluation  01/27/19    PT Start Time  0845    PT Stop Time  0928    PT Time Calculation (min)  43 min    Activity Tolerance  Patient tolerated treatment well    Behavior During Therapy  [facility] for tasks assessed/performed       Past Medical History:  Diagnosis Date  . Arthralgia    LLE  . Kidney stones   . Plantar fasciitis    bilateral    Past Surgical History:  Procedure Laterality Date  . HERNIA REPAIR     as a child     There were no vitals filed for this visit.  Subjective Assessment - 12/29/18 0849    Subjective  really tight in lateral leg and foot. Still noticing Lateral leg    Patient Stated Goals  less pain and cramping at night, be able [place]                               PT Education - 12/29/18 1325    Education Details  see plan    Person(s) Educated  Patient    Methods  Explanation    Comprehension  Verbalized understanding          PT Long Term Goals - 12/16/18 0809      PT LONG TERM GOAL #1   Title  Pt will be I and compliant with HEP. (Target 6 weeks 12/15/18 for all goals)      PT LONG TERM GOAL #2   Title  Pt will report overall less than 2-3/10 overall pain in neck, thigh, calf, foot with decrased cramps.    Baseline  3/10, still cramping on top of foot; I don't feel like I have to pop my toes as often      PT LONG TERM GOAL #3   Title  Pt will improve Lt hip strength to 5/5 MMT.    Baseline  see flowsheet            Plan - 12/29/18 1319    Clinical  Impression Statement  Mobilized joints from T10 to distal tib-fib- mild limitation L3-5 without changes in symptoms. Still has weakness in proximal hip musculature which is likely contributing to distal instability and muscular activation. We will continue to create HEP to target stability and pt will f/u with Dr Jaynee Eagles to discuss possible further testing. continued schedule 1/week 4 weeks but may alter or not use all due to insurance limiting to 20 visits.    PT Treatment/Interventions  ADLs/Self Care Home Management;Cryotherapy;Electrical Stimulation;Iontophoresis 4mg /ml Dexamethasone;Moist Heat;Traction;Ultrasound;Therapeutic activities;Therapeutic exercise;Neuromuscular re-education;Manual techniques;Taping;Passive range of motion;Dry needling;Joint Manipulations;Spinal Manipulations    PT Next Visit Plan  continue SLS exercises, sumo squats&hops, curtsey lunges    PT Home Exercise Plan  bridge with ball, ITB stretch, Lt resisted adduction, abdominal engagement (try on bike), primal push up, eccentric heel raises; single leg reach to floor, single leg chops  Consulted and Agree with Plan of Care  Patient       Patient will benefit from skilled therapeutic intervention in order to improve the following deficits and impairments:  Decreased strength, Impaired flexibility, Pain, Difficulty walking, Increased muscle spasms  Visit Diagnosis: 1. Pain in left ankle and joints of left foot   2. Pain in left thigh   3. Cervicalgia        Problem List Patient Active Problem List   Diagnosis Date Noted  . Peroneal neuropathy at knee, left 10/14/2018  . Ganglion of flexor tendon sheath of left middle finger 04/20/2017  . Anxiety and depression 02/27/2016  . Neuropathic pain of foot, left 09/10/2015  . Morning joint stiffness 09/10/2015  . Cervical disc disorder with radiculopathy of cervical region 04/10/2015  . Floater, vitreous 02/12/2015  . Disease of thyroid gland 02/12/2015  . High myopia  02/12/2015  . Hypothyroidism, postop 08/15/2014  . Malignant neoplasm of thyroid gland ([place]) 08/15/2014  . Postoperative hypothyroidism 08/15/2014  . Plantar fasciitis of left foot 05/16/2012  . Anemia 05/16/2012  . Calculus of kidney 06/06/2011   Benjamin Peters C. Benjamin Zimmerman PT, DPT 12/29/18 9:39 PM   [place] [facility] [address] [place], [place], [address] Phone: [phone]   Fax:  [phone]  Name: [name] MRN: [id number] Date of Birth: [birth year]

## 2019-01-02 ENCOUNTER — Encounter: Payer: Self-pay | Admitting: *Deleted

## 2019-01-02 ENCOUNTER — Telehealth: Payer: Self-pay | Admitting: Neurology

## 2019-01-02 NOTE — Telephone Encounter (Signed)
Pt would like a call back to discuss about getting MRI and other testings done. Please advise.

## 2019-01-02 NOTE — Telephone Encounter (Signed)
I spoke with the patient. He prefers to address his questions over mychart rather than schedule an appt. He was unable to select Dr. Jaynee Eagles as an option to message so I initiated a message to pt. He verbalized appreciation for the call.

## 2019-01-03 ENCOUNTER — Telehealth: Payer: Self-pay | Admitting: Neurology

## 2019-01-03 NOTE — Telephone Encounter (Signed)
Patient needs an emg/ncs. Look like I have an 1130 open on August 20th. Can we block that so no one else takes it and schedule patient there? Please block it and call patient and offer it, if he declines please offer him next available. Thanks!

## 2019-01-04 NOTE — Telephone Encounter (Signed)
Lovena Le is not available to help, cn we offer him first available. thanks

## 2019-01-04 NOTE — Telephone Encounter (Signed)
Benjamin Zimmerman please see me before you call.

## 2019-01-04 NOTE — Telephone Encounter (Signed)
Spoke with pt and scheduled him for first avail EMG/NCV on 02/16/2019 at 8:30 arrival 15 minutes earlier. Pt verbalized appreciation and his questions were answered.

## 2019-01-05 ENCOUNTER — Other Ambulatory Visit: Payer: Self-pay

## 2019-01-05 ENCOUNTER — Encounter: Payer: Self-pay | Admitting: Physical Therapy

## 2019-01-05 ENCOUNTER — Ambulatory Visit: Payer: Managed Care, Other (non HMO) | Admitting: Physical Therapy

## 2019-01-05 DIAGNOSIS — M25572 Pain in left ankle and joints of left foot: Secondary | ICD-10-CM

## 2019-01-05 DIAGNOSIS — M79652 Pain in left thigh: Secondary | ICD-10-CM

## 2019-01-05 NOTE — Therapy (Addendum)
[place], [place], [address] Phone: [phone]   Fax:  [phone]  Physical Therapy Treatment/Discharge  Patient Details  Name: ROGERS DITTER MRN: [id number] Date of Birth: 11/07/1960 Referring Provider (PT): Melvenia Beam, MD   Encounter Date: 01/05/2019  PT End of Session - 01/05/19 0928    Visit Number  12    Number of Visits  15    Date for PT Re-Evaluation  01/27/19    Authorization Type  [employer]    PT Start Time  [phone]    PT Stop Time  0929    PT Time Calculation (min)  38 min    Activity Tolerance  Patient tolerated treatment well    Behavior During Therapy  [facility] for tasks assessed/performed       Past Medical History:  Diagnosis Date  . Arthralgia    LLE  . Kidney stones   . Plantar fasciitis    bilateral    Past Surgical History:  Procedure Laterality Date  . HERNIA REPAIR     as a child     There were no vitals filed for this visit.                    [facility] Adult PT Treatment/Exercise - 01/05/19 0001      Exercises   Other Exercises   see scanned pt instructions                  PT Long Term Goals - 12/16/18 0809      PT LONG TERM GOAL #1   Title  Pt will be I and compliant with HEP. (Target 6 weeks 12/15/18 for all goals)      PT LONG TERM GOAL #2   Title  Pt will report overall less than 2-3/10 overall pain in neck, thigh, calf, foot with decrased cramps.    Baseline  3/10, still cramping on top of foot; I don't feel like I have to pop my toes as often      PT LONG TERM GOAL #3   Title  Pt will improve Lt hip strength to 5/5 MMT.    Baseline  see flowsheet            Plan - 01/05/19 0934    Clinical Impression Statement  Added increased strengthening challenges to proximal musculature and plyometrics. Also provided with options for ITBand stretches as he is having some irritation at greater trochanter. Pt has limited visit allowance  through insurance so, in the interest of preserving visits, he will do his HEP and return in 2 weeks (if necessary) and will be having NCV done at the end of September.    PT Treatment/Interventions  ADLs/Self Care Home Management;Cryotherapy;Electrical Stimulation;Iontophoresis 51m/ml Dexamethasone;Moist Heat;Traction;Ultrasound;Therapeutic activities;Therapeutic exercise;Neuromuscular re-education;Manual techniques;Taping;Passive range of motion;Dry needling;Joint Manipulations;Spinal Manipulations    PT Next Visit Plan  progress HEP PRN    PT Home Exercise Plan  bridge with ball, ITB stretch, Lt resisted adduction, abdominal engagement (try on bike), primal push up, eccentric heel raises; single leg reach to floor, single leg chops, see scanned instructions    Consulted and Agree with Plan of Care  Patient       Patient will benefit from skilled therapeutic intervention in order to improve the following deficits and impairments:  Decreased strength, Impaired flexibility, Pain, Difficulty walking, Increased muscle spasms  Visit Diagnosis: 1. Pain in left ankle and joints of left foot   2.  Pain in left thigh        Problem List Patient Active Problem List   Diagnosis Date Noted  . Peroneal neuropathy at knee, left 10/14/2018  . Ganglion of flexor tendon sheath of left middle finger 04/20/2017  . Anxiety and depression 02/27/2016  . Neuropathic pain of foot, left 09/10/2015  . Morning joint stiffness 09/10/2015  . Cervical disc disorder with radiculopathy of cervical region 04/10/2015  . Floater, vitreous 02/12/2015  . Disease of thyroid gland 02/12/2015  . High myopia 02/12/2015  . Hypothyroidism, postop 08/15/2014  . Malignant neoplasm of thyroid gland ([place]) 08/15/2014  . Postoperative hypothyroidism 08/15/2014  . Plantar fasciitis of left foot 05/16/2012  . Anemia 05/16/2012  . Calculus of kidney 06/06/2011    Jalin Alicea C. Runette Scifres PT, DPT 01/05/19 12:06 PM   [facility] [address] [place], [place], [address] Phone: [phone]   Fax:  [phone]  Name: TAKSH HJORT MRN: [id number] Date of Birth: 1960/12/16  PHYSICAL THERAPY DISCHARGE SUMMARY  Visits from Start of Care: 12  Current functional level related to goals / functional outcomes: See above   Remaining deficits: See above   Education / Equipment: Anatomy of condition, POC, HEP, exercise form/rationale  Plan: Patient agrees to discharge.  Patient goals were partially met. Patient is being discharged due to being pleased with the current functional level.  ?????    Also, by limitations of insurance allowance for PT visits.  Lillah Standre C. Alyn Riedinger PT, DPT 01/24/19 1:23 PM

## 2019-01-12 ENCOUNTER — Ambulatory Visit: Payer: Managed Care, Other (non HMO) | Admitting: Physical Therapy

## 2019-01-19 ENCOUNTER — Encounter: Payer: Managed Care, Other (non HMO) | Admitting: Physical Therapy

## 2019-01-26 ENCOUNTER — Ambulatory Visit: Payer: Managed Care, Other (non HMO) | Admitting: Physical Therapy

## 2019-02-16 ENCOUNTER — Ambulatory Visit (INDEPENDENT_AMBULATORY_CARE_PROVIDER_SITE_OTHER): Payer: Managed Care, Other (non HMO) | Admitting: Neurology

## 2019-02-16 ENCOUNTER — Other Ambulatory Visit: Payer: Self-pay

## 2019-02-16 DIAGNOSIS — M79605 Pain in left leg: Secondary | ICD-10-CM | POA: Diagnosis not present

## 2019-02-16 DIAGNOSIS — R200 Anesthesia of skin: Secondary | ICD-10-CM | POA: Diagnosis not present

## 2019-02-16 DIAGNOSIS — R29898 Other symptoms and signs involving the musculoskeletal system: Secondary | ICD-10-CM

## 2019-02-16 DIAGNOSIS — G5600 Carpal tunnel syndrome, unspecified upper limb: Secondary | ICD-10-CM

## 2019-02-16 DIAGNOSIS — Z0289 Encounter for other administrative examinations: Secondary | ICD-10-CM

## 2019-02-16 DIAGNOSIS — M5412 Radiculopathy, cervical region: Secondary | ICD-10-CM

## 2019-02-16 DIAGNOSIS — G5702 Lesion of sciatic nerve, left lower limb: Secondary | ICD-10-CM

## 2019-02-16 DIAGNOSIS — G5732 Lesion of lateral popliteal nerve, left lower limb: Secondary | ICD-10-CM | POA: Diagnosis not present

## 2019-02-16 DIAGNOSIS — M542 Cervicalgia: Secondary | ICD-10-CM

## 2019-02-16 DIAGNOSIS — G8929 Other chronic pain: Secondary | ICD-10-CM

## 2019-02-16 DIAGNOSIS — G5603 Carpal tunnel syndrome, bilateral upper limbs: Secondary | ICD-10-CM

## 2019-02-16 DIAGNOSIS — R202 Paresthesia of skin: Secondary | ICD-10-CM | POA: Diagnosis not present

## 2019-02-16 NOTE — Progress Notes (Signed)
58  Year old with sensory changes in a peroneal nerve distribution on the left lower leg. MRI lumbar spine did not show any etiology. He also has hand numbness. I reviewed the findings today on emg/ncs (see procedure note) which showed moderately-severe left > right bilateral Carpal Tunnel Syndrome and a left lower extremity peroneal sensory mononeuropathy.  +Mcphalen's maneuver. No findings to explain left lateral thigh soreness: Likely muscular. Findings: - Bilateral CTS: Referral to Dr. Vertell Limber NSY - peroneal neuropathy: chronic, unknown etiology, cannot localize possibly at fibular head. - MRI cervical spine to evaluate for concomitant cervical radiculopathy - May consider repeating MRI lumbar spine, but no lumbar radiculopathy suggested by this exam  Orders Placed This Encounter  Procedures  . MR CERVICAL SPINE WO CONTRAST  . Ambulatory referral to Neurosurgery  . NCV with EMG(electromyography)    A total of 25 minutes was spent face-to-face with this patient. Over half this time was spent on counseling patient on the  1. Bilateral carpal tunnel syndrome   2. Numbness and tingling in both hands   3. Left peroneal mononeuropathy   4. Left leg pain   5. Cervicalgia   6. Hand numbness   7. Hand weakness   8. Chronic neck pain   9. Cervical radiculopathy    diagnosis and different diagnostic and therapeutic options, counseling and coordination of care, risks ans benefits of management, compliance, or risk factor reduction and education.  This does not include time spent on emg/ncs.

## 2019-02-16 NOTE — Patient Instructions (Signed)
Common Peroneal Nerve Entrapment  Common peroneal nerve entrapment is a condition that can make it hard to lift a foot. The condition results from pressure on a nerve in the lower leg called the common peroneal nerve. Your common peroneal nerve provides feeling to your outer lower leg and foot. It also supplies the muscles that move your foot and toes upward and outward. What are the causes? This condition may be caused by:  Sitting cross-legged, squatting, or kneeling for long periods of time.  A hard, direct hit to the side of the lower leg.  Swelling from a knee injury.  A break (fracture) in one of the lower leg bones.  Wearing a boot or cast that ends just below the knee.  A growth or cyst near the nerve. What increases the risk? This condition is more likely to develop in people who play:  Contact sports, such as football or hockey.  Sports where you wear high and stiff boots, such as skiing. What are the signs or symptoms? Symptoms of this condition include:  Trouble lifting your foot up (foot drop).  Tripping often.  Your foot hitting the ground harder than normal as you walk.  Numbness, tingling, or pain in the outside of the knee, outside of the lower leg, and top of the foot.  Sensitivity to pressure on the front or side of the leg. How is this diagnosed? This condition may be diagnosed based on:  Your symptoms.  Your medical history.  A physical exam.  Tests, such as: ? An X-ray to check the bones of your knee and leg. ? MRI to check tendons that attach to the side of your knee. ? An ultrasound to check for a growth or cyst. ? An electromyogram (EMG) to check your nerves. During your physical exam, your health care provider will check for numbness in your leg and test the strength of your lower leg muscles. He or she may tap the side of your lower leg to see if that causes tingling. How is this treated? Treatment for this condition may include:   Avoiding activities that make symptoms worse.  Using a brace to hold up your foot and toes.  Taking anti-inflammatory pain medicines to relieve swelling and lessen pain.  Having medicines injected into your ankle joint to lessen pain and swelling.  Doing exercises to help you regain or maintain movement (physical therapy).  Surgery to take pressure off the nerve. This may be needed if there is no improvement after 2-3 months or if there is a growth pushing on the nerve.  Returning gradually to full activity. Follow these instructions at home: If you have a brace:  Wear it as told by your health care provider. Remove it only as told by your health care provider.  Loosen the brace if your toes tingle, become numb, or turn cold and blue.  Keep the brace clean.  If the brace is not waterproof: ? Do not let it get wet. ? Cover it with a watertight covering when you take a bath or a shower.  Ask your health care provider when it is safe to drive with a brace on your foot. Activity  Return to your normal activities as told by your health care provider. Ask your health care provider what activities are safe for you.  Do not do any activities that make pain or swelling worse.  Do exercises as told by your health care provider. General instructions  Take over-the-counter and prescription medicines  only as told by your health care provider.  Do not put your full weight on your knee until your health care provider says you can. Use crutches as directed by your health care provider.  Keep all follow-up visits as told by your health care provider. This is important. How is this prevented?  Wear supportive footwear that is appropriate for your athletic activity.  Avoid athletic activities that cause ankle pain or swelling.  Wear protective padding over your lower legs when playing contact sports.  Make sure your boots do not put extra pressure on the area just below your knees.   Do not sit cross-legged for long periods of time. Contact a health care provider if:  Your symptoms do not get better in 2-3 months.  The weakness or numbness in your leg or foot gets worse. Summary  Common peroneal nerve entrapment is a condition that results from pressure on a nerve in the lower leg called the common peroneal nerve.  This condition may be caused by a hard hit, swelling, a fracture, or a cyst in the lower leg.  Treatment may include rest, a brace, medicines, and physical therapy. Sometimes surgery is needed.  Do not do any activities that make pain or swelling worse. This information is not intended to replace advice given to you by your health care provider. Make sure you discuss any questions you have with your health care provider. Document Released: 05/11/2005 Document Revised: 03/21/2018 Document Reviewed: 03/21/2018 Elsevier Patient Education  [place] tunnel syndrome is a condition that causes pain in your hand and arm. The carpal tunnel is a narrow area located on the palm side of your wrist. Repeated wrist motion or certain diseases may cause swelling within the tunnel. This swelling pinches the main nerve in the wrist (median nerve). What are the causes? This condition may be caused by:  Repeated wrist motions.  Wrist injuries.  Arthritis.  A cyst or tumor in the carpal tunnel.  Fluid buildup during pregnancy. Sometimes the cause of this condition is not known. What increases the risk? The following factors may make you more likely to develop this condition:  Having a job, such as being a Research scientist (life sciences), that requires you to repeatedly move your wrist in the same motion.  Being a woman.  Having certain conditions, such as: ? Diabetes. ? Obesity. ? An underactive thyroid (hypothyroidism). ? Kidney failure. What are the signs or symptoms? Symptoms of this condition include:  A tingling  feeling in your fingers, especially in your thumb, index, and middle fingers.  Tingling or numbness in your hand.  An aching feeling in your entire arm, especially when your wrist and elbow are bent for a long time.  Wrist pain that goes up your arm to your shoulder.  Pain that goes down into your palm or fingers.  A weak feeling in your hands. You may have trouble grabbing and holding items. Your symptoms may feel worse during the night. How is this diagnosed? This condition is diagnosed with a medical history and physical exam. You may also have tests, including:  Electromyogram (EMG). This test measures electrical signals sent by your nerves into the muscles.  Nerve conduction study. This test measures how well electrical signals pass through your nerves.  Imaging tests, such as X-rays, ultrasound, and MRI. These tests check for possible causes of your condition. How is this treated? This condition may be treated with:  Lifestyle changes. It is important to stop or change the activity that caused your condition.  Doing exercise and activities to strengthen your muscles and bones (physical therapy).  Learning how to use your hand again after diagnosis (occupational therapy).  Medicines for pain and inflammation. This may include medicine that is injected into your wrist.  A wrist splint.  Surgery. Follow these instructions at home: If you have a splint:  Wear the splint as told by your health care provider. Remove it only as told by your health care provider.  Loosen the splint if your fingers tingle, become numb, or turn cold and blue.  Keep the splint clean.  If the splint is not waterproof: ? Do not let it get wet. ? Cover it with a watertight covering when you take a bath or shower. Managing pain, stiffness, and swelling   If directed, put ice on the painful area: ? If you have a removable splint, remove it as told by your health care provider. ? Put ice in a  plastic bag. ? Place a towel between your skin and the bag. ? Leave the ice on for 20 minutes, 2-3 times per day. General instructions  Take over-the-counter and prescription medicines only as told by your health care provider.  Rest your wrist from any activity that may be causing your pain. If your condition is work related, talk with your employer about changes that can be made, such as getting a wrist pad to use while typing.  Do any exercises as told by your health care provider, physical therapist, or occupational therapist.  Keep all follow-up visits as told by your health care provider. This is important. Contact a health care provider if:  You have new symptoms.  Your pain is not controlled with medicines.  Your symptoms get worse. Get help right away if:  You have severe numbness or tingling in your wrist or hand. Summary  Carpal tunnel syndrome is a condition that causes pain in your hand and arm.  It is usually caused by repeated wrist motions.  Lifestyle changes and medicines are used to treat carpal tunnel syndrome. Surgery may be recommended.  Follow your health care provider's instructions about wearing a splint, resting from activity, keeping follow-up visits, and calling for help. This information is not intended to replace advice given to you by your health care provider. Make sure you discuss any questions you have with your health care provider. Document Released: 05/08/2000 Document Revised: 09/17/2017 Document Reviewed: 09/17/2017 Elsevier Patient Education  2020 [employer].

## 2019-02-18 DIAGNOSIS — R200 Anesthesia of skin: Secondary | ICD-10-CM | POA: Insufficient documentation

## 2019-02-18 DIAGNOSIS — M79605 Pain in left leg: Secondary | ICD-10-CM | POA: Insufficient documentation

## 2019-02-18 NOTE — Progress Notes (Signed)
See procedure note.

## 2019-02-18 NOTE — Progress Notes (Signed)
Full Name: Benjamin Zimmerman Gender: Male MRN #: [id number] Date of Birth: March 25, 1961    Visit Date: 02/16/2019 08:10 Age: 58 Years 2 Months Old Examining Physician: Sarina Ill, MD  Referring Physician: Vernie Shanks, MD   History: 58  Year old with sensory changes in a peroneal nerve distribution on the left lower leg. MRI lumbar spine did not show any etiology. He also has hand numbness likely Carpal Tunnel Syndrome, tightness in the cervical muscle (myofascial cervical pain), and left leg muscular-type pain also plantar fasciitis.  Summary: EMG/NCS was performed on the bilateral upper extremities and left lower extremity.   The right median APB motor nerve showed prolonged distal onset latency (5.1 ms, N<4.4).  The left  median APB motor nerve showed prolonged distal onset latency (6.7 ms, N<4.4).  The right Median 2nd Digit orthodromic sensory nerve showed prolonged distal peak latency (3.34ms, N<3.4) and reduced amplitude(8uV, N>10).  The left  Median 2nd Digit orthodromic sensory nerve showed prolonged distal peak latency (4.8 ms, N<3.4) and reduced amplitude(7 uV, N>10).   The right median/ulnar (palm) comparison nerve showed prolonged distal peak latency (Median Palm, 2.9 ms, N<2.2) and abnormal peak latency difference (Median Palm-Ulnar Palm, 0.8 ms, N<0.4) with a relative median delay.   The left median/ulnar (palm) comparison nerve showed prolonged distal peak latency (Median Palm, 3.4 ms, N<2.2) and abnormal peak latency difference (Median Palm-Ulnar Palm, 1.4  ms, N<0.4) with a relative median delay. The left superficial peroneal sensory nerve showed reduced amplitude(3uV, N>6) All remaining nerves (as indicated in the following tables) were within normal limits.  All muscles (as indicated in the following tables) were within normal limits.     Conclusion: 1. There is moderately-severe left > right bilateral Carpal Tunnel Syndrome. 2. There is a left lower extremity  peroneal sensory mononeuropathy.    Sarina Ill, M.D.  [facility] [place], [place] [address] Tel: [phone] Fax: [phone]        [facility]    Nerve / Sites Muscle Latency Ref. Amplitude Ref. Rel Amp Segments Distance Velocity Ref. Area    ms ms mV mV %  cm m/s m/s mVms  R Median - APB     Wrist APB 5.1 ?4.4 7.3 ?4.0 100 Wrist - APB 7   25.5     Upper arm APB 8.9  7.0  95.5 Upper arm - Wrist 25 66 ?49 26.4  L Median - APB     Wrist APB 6.7 ?4.4 5.8 ?4.0 100 Wrist - APB 7   21.4     Upper arm APB 11.1  6.0  103 Upper arm - Wrist 24 54 ?49 22.6  R Ulnar - ADM     Wrist ADM 2.7 ?3.3 8.4 ?6.0 100 Wrist - ADM 7   36.1     B.Elbow ADM 6.3  7.5  88.9 B.Elbow - Wrist 21 58 ?49 31.9     A.Elbow ADM 8.1  7.0  93 A.Elbow - B.Elbow 10 53 ?49 31.0         A.Elbow - Wrist      L Ulnar - ADM     Wrist ADM 2.9 ?3.3 9.8 ?6.0 100 Wrist - ADM 7   42.0     B.Elbow ADM 6.3  9.3  95.5 B.Elbow - Wrist 21 63 ?49 41.4     A.Elbow ADM 7.9  9.5  102 A.Elbow - B.Elbow 10 60 ?49 41.1  A.Elbow - Wrist      L Peroneal - EDB     Ankle EDB 5.1 ?6.5 2.8 ?2.0 100 Ankle - EDB 9   12.3     Fib head EDB 11.9  2.8  101 Fib head - Ankle 31 45 ?44 13.2     Pop fossa EDB 14.1  2.9  106 Pop fossa - Fib head 10 45 ?44 14.0         Pop fossa - Ankle      L Tibial - AH     Ankle AH 3.9 ?5.8 7.6 ?4.0 100 Ankle - AH 9   17.7     Pop fossa AH 13.2  6.1  81 Pop fossa - Ankle 38 41 ?41 17.1  L Peroneal - Tib Ant     Fib Head Tib Ant 3.1 ?4.7 8.0 ?3.0 100 Fib Head - Tib Ant 10   57.4     Pop fossa Tib Ant 4.8  8.2  103 Pop fossa - Fib Head 10 56 ?44 57.8  L Femoral - Vastus Med     B. Ing Lig Vastus Med 3.0  4.2 N>4 100 B. Ing Lig - Vastus Med 19   24.8                      SNC    Nerve / Sites Rec. Site Peak Lat Ref.  Amp Ref. Segments Distance Peak Diff Ref.    ms ms V V  cm ms ms  L Sural - Ankle (Calf)     Calf Ankle 3.8 ?4.4 6 ?6 Calf - Ankle 14    L Superficial  peroneal - Ankle     Lat leg Ankle 4.3 ?4.4 3 ?6 Lat leg - Ankle 14    R Superficial peroneal - Ankle     Lat leg Ankle 4.4 ?4.4 7 ?6 Lat leg - Ankle 14    R Median, Ulnar - Transcarpal comparison     Median Palm Wrist 2.9 ?2.2 37 ?35 Median Palm - Wrist 8       Ulnar Palm Wrist 2.1 ?2.2 14 ?12 Ulnar Palm - Wrist 8          Median Palm - Ulnar Palm  0.8 ?0.4  L Median, Ulnar - Transcarpal comparison     Median Palm Wrist 3.4 ?2.2 44 ?35 Median Palm - Wrist 8       Ulnar Palm Wrist 2.0 ?2.2 18 ?12 Ulnar Palm - Wrist 8          Median Palm - Ulnar Palm  1.4 ?0.4  R Median - Orthodromic (Dig II, Mid palm)     Dig II Wrist 3.9 ?3.4 8 ?10 Dig II - Wrist 13    L Median - Orthodromic (Dig II, Mid palm)     Dig II Wrist 4.8 ?3.4 7 ?10 Dig II - Wrist 13    R Ulnar - Orthodromic, (Dig V, Mid palm)     Dig V Wrist 2.9 ?3.1 6 ?5 Dig V - Wrist 11    L Ulnar - Orthodromic, (Dig V, Mid palm)     Dig V Wrist 2.9 ?3.1 6 ?5 Dig V - Wrist 11    L Saphenous - Ankle (Medial leg)     Medial leg Ankle 4.0 ?4.2 6 ?5 Medial leg - Ankle 14  F  Wave    Nerve F Lat Ref.   ms ms  R Ulnar - ADM 28.0 ?32.0  L Ulnar - ADM 28.4 ?32.0  L Tibial - AH 49.1 ?56.0           EMG full       EMG Summary Table    Spontaneous MUAP Recruitment  Muscle IA Fib PSW Fasc Other Amp Dur. Poly Pattern  L. Deltoid Normal None None None _______ Normal Normal Normal Normal  L. Triceps brachii Normal None None None _______ Normal Normal Normal Normal  L. Pronator teres Normal None None None _______ Normal Normal Normal Normal  L. Opponens pollicis Normal None None None _______ Normal Normal Normal Normal  L. First dorsal interosseous Normal None None None _______ Normal Normal Normal Normal  L. Cervical paraspinals (low) Normal None None None _______ Normal Normal Normal Normal  L. Iliopsoas Normal None None None _______ Normal Normal Normal Normal  L. Vastus medialis Normal None None None _______  Normal Normal Normal Normal  L. Tibialis anterior Normal None None None _______ Normal Normal Normal Normal  L. Gastrocnemius (Medial head) Normal None None None _______ Normal Normal Normal Normal  L. Extensor hallucis longus Normal None None None _______ Normal Normal Normal Normal  L. Biceps femoris (long head) Normal None None None _______ Normal Normal Normal Normal  L. Gluteus maximus Normal None None None _______ Normal Normal Normal Normal  L. Gluteus medius Normal None None None _______ Normal Normal Normal Normal  L. Lumbar paraspinals (low) Normal None None None _______ Normal Normal Normal Normal

## 2019-02-20 ENCOUNTER — Encounter: Payer: Self-pay | Admitting: Neurology

## 2019-02-20 DIAGNOSIS — G5732 Lesion of lateral popliteal nerve, left lower limb: Secondary | ICD-10-CM | POA: Insufficient documentation

## 2019-02-20 DIAGNOSIS — G5603 Carpal tunnel syndrome, bilateral upper limbs: Secondary | ICD-10-CM | POA: Insufficient documentation

## 2019-02-20 NOTE — Procedures (Signed)
Full Name: Benjamin Zimmerman Gender: Male MRN #: [id number] Date of Birth: 05-30-1960    Visit Date: 02/16/2019 08:10 Age: 58 Years 2 Months Old Examining Physician: Sarina Ill, MD  Referring Physician: Vernie Shanks, MD   History: 58  Year old with sensory changes in a peroneal nerve distribution on the left lower leg. MRI lumbar spine did not show any etiology. He also has hand numbness likely Carpal Tunnel Syndrome, tightness in the cervical muscle (myofascial cervical pain), and left leg muscular-type pain also plantar fasciitis.  Summary: EMG/NCS was performed on the bilateral upper extremities and left lower extremity.   The right median APB motor nerve showed prolonged distal onset latency (5.1 ms, N<4.4).  The left  median APB motor nerve showed prolonged distal onset latency (6.7 ms, N<4.4).  The right Median 2nd Digit orthodromic sensory nerve showed prolonged distal peak latency (3.28ms, N<3.4) and reduced amplitude(8uV, N>10).  The left  Median 2nd Digit orthodromic sensory nerve showed prolonged distal peak latency (4.8 ms, N<3.4) and reduced amplitude(7 uV, N>10).   The right median/ulnar (palm) comparison nerve showed prolonged distal peak latency (Median Palm, 2.9 ms, N<2.2) and abnormal peak latency difference (Median Palm-Ulnar Palm, 0.8 ms, N<0.4) with a relative median delay.   The left median/ulnar (palm) comparison nerve showed prolonged distal peak latency (Median Palm, 3.4 ms, N<2.2) and abnormal peak latency difference (Median Palm-Ulnar Palm, 1.4  ms, N<0.4) with a relative median delay. The left superficial peroneal sensory nerve showed reduced amplitude(3uV, N>6) All remaining nerves (as indicated in the following tables) were within normal limits.  All muscles (as indicated in the following tables) were within normal limits.     Conclusion: 1. There is moderately-severe left > right bilateral Carpal Tunnel Syndrome. 2. There is a left lower extremity  peroneal sensory mononeuropathy.    Sarina Ill, M.D.  [facility] [place], [place] [address] Tel: [phone] Fax: [phone]        [facility]    Nerve / Sites Muscle Latency Ref. Amplitude Ref. Rel Amp Segments Distance Velocity Ref. Area    ms ms mV mV %  cm m/s m/s mVms  R Median - APB     Wrist APB 5.1 ?4.4 7.3 ?4.0 100 Wrist - APB 7   25.5     Upper arm APB 8.9  7.0  95.5 Upper arm - Wrist 25 66 ?49 26.4  L Median - APB     Wrist APB 6.7 ?4.4 5.8 ?4.0 100 Wrist - APB 7   21.4     Upper arm APB 11.1  6.0  103 Upper arm - Wrist 24 54 ?49 22.6  R Ulnar - ADM     Wrist ADM 2.7 ?3.3 8.4 ?6.0 100 Wrist - ADM 7   36.1     B.Elbow ADM 6.3  7.5  88.9 B.Elbow - Wrist 21 58 ?49 31.9     A.Elbow ADM 8.1  7.0  93 A.Elbow - B.Elbow 10 53 ?49 31.0         A.Elbow - Wrist      L Ulnar - ADM     Wrist ADM 2.9 ?3.3 9.8 ?6.0 100 Wrist - ADM 7   42.0     B.Elbow ADM 6.3  9.3  95.5 B.Elbow - Wrist 21 63 ?49 41.4     A.Elbow ADM 7.9  9.5  102 A.Elbow - B.Elbow 10 60 ?49 41.1  A.Elbow - Wrist      L Peroneal - EDB     Ankle EDB 5.1 ?6.5 2.8 ?2.0 100 Ankle - EDB 9   12.3     Fib head EDB 11.9  2.8  101 Fib head - Ankle 31 45 ?44 13.2     Pop fossa EDB 14.1  2.9  106 Pop fossa - Fib head 10 45 ?44 14.0         Pop fossa - Ankle      L Tibial - AH     Ankle AH 3.9 ?5.8 7.6 ?4.0 100 Ankle - AH 9   17.7     Pop fossa AH 13.2  6.1  81 Pop fossa - Ankle 38 41 ?41 17.1  L Peroneal - Tib Ant     Fib Head Tib Ant 3.1 ?4.7 8.0 ?3.0 100 Fib Head - Tib Ant 10   57.4     Pop fossa Tib Ant 4.8  8.2  103 Pop fossa - Fib Head 10 56 ?44 57.8  L Femoral - Vastus Med     B. Ing Lig Vastus Med 3.0  4.2 N>4 100 B. Ing Lig - Vastus Med 19   24.8                      SNC    Nerve / Sites Rec. Site Peak Lat Ref.  Amp Ref. Segments Distance Peak Diff Ref.    ms ms V V  cm ms ms  L Sural - Ankle (Calf)     Calf Ankle 3.8 ?4.4 6 ?6 Calf - Ankle 14    L Superficial  peroneal - Ankle     Lat leg Ankle 4.3 ?4.4 3 ?6 Lat leg - Ankle 14    R Superficial peroneal - Ankle     Lat leg Ankle 4.4 ?4.4 7 ?6 Lat leg - Ankle 14    R Median, Ulnar - Transcarpal comparison     Median Palm Wrist 2.9 ?2.2 37 ?35 Median Palm - Wrist 8       Ulnar Palm Wrist 2.1 ?2.2 14 ?12 Ulnar Palm - Wrist 8          Median Palm - Ulnar Palm  0.8 ?0.4  L Median, Ulnar - Transcarpal comparison     Median Palm Wrist 3.4 ?2.2 44 ?35 Median Palm - Wrist 8       Ulnar Palm Wrist 2.0 ?2.2 18 ?12 Ulnar Palm - Wrist 8          Median Palm - Ulnar Palm  1.4 ?0.4  R Median - Orthodromic (Dig II, Mid palm)     Dig II Wrist 3.9 ?3.4 8 ?10 Dig II - Wrist 13    L Median - Orthodromic (Dig II, Mid palm)     Dig II Wrist 4.8 ?3.4 7 ?10 Dig II - Wrist 13    R Ulnar - Orthodromic, (Dig V, Mid palm)     Dig V Wrist 2.9 ?3.1 6 ?5 Dig V - Wrist 11    L Ulnar - Orthodromic, (Dig V, Mid palm)     Dig V Wrist 2.9 ?3.1 6 ?5 Dig V - Wrist 11    L Saphenous - Ankle (Medial leg)     Medial leg Ankle 4.0 ?4.2 6 ?5 Medial leg - Ankle 14  F  Wave    Nerve F Lat Ref.   ms ms  R Ulnar - ADM 28.0 ?32.0  L Ulnar - ADM 28.4 ?32.0  L Tibial - AH 49.1 ?56.0           EMG full       EMG Summary Table    Spontaneous MUAP Recruitment  Muscle IA Fib PSW Fasc Other Amp Dur. Poly Pattern  L. Deltoid Normal None None None _______ Normal Normal Normal Normal  L. Triceps brachii Normal None None None _______ Normal Normal Normal Normal  L. Pronator teres Normal None None None _______ Normal Normal Normal Normal  L. Opponens pollicis Normal None None None _______ Normal Normal Normal Normal  L. First dorsal interosseous Normal None None None _______ Normal Normal Normal Normal  L. Cervical paraspinals (low) Normal None None None _______ Normal Normal Normal Normal  L. Iliopsoas Normal None None None _______ Normal Normal Normal Normal  L. Vastus medialis Normal None None None _______  Normal Normal Normal Normal  L. Tibialis anterior Normal None None None _______ Normal Normal Normal Normal  L. Gastrocnemius (Medial head) Normal None None None _______ Normal Normal Normal Normal  L. Extensor hallucis longus Normal None None None _______ Normal Normal Normal Normal  L. Biceps femoris (long head) Normal None None None _______ Normal Normal Normal Normal  L. Gluteus maximus Normal None None None _______ Normal Normal Normal Normal  L. Gluteus medius Normal None None None _______ Normal Normal Normal Normal  L. Lumbar paraspinals (low) Normal None None None _______ Normal Normal Normal Normal

## 2019-02-21 ENCOUNTER — Telehealth: Payer: Self-pay | Admitting: Neurology

## 2019-02-21 NOTE — Telephone Encounter (Signed)
[employer] order sent to [facility]. They will obtain the auth and reach out to the patient to schedule.  

## 2019-03-05 ENCOUNTER — Ambulatory Visit
Admission: RE | Admit: 2019-03-05 | Discharge: 2019-03-05 | Disposition: A | Discharge status: Home / Self Care | Payer: Managed Care, Other (non HMO) | Source: Ambulatory Visit | Attending: Neurology | Admitting: Neurology

## 2019-03-05 ENCOUNTER — Other Ambulatory Visit: Payer: Self-pay

## 2019-03-05 DIAGNOSIS — R200 Anesthesia of skin: Secondary | ICD-10-CM

## 2019-03-05 DIAGNOSIS — G8929 Other chronic pain: Secondary | ICD-10-CM

## 2019-03-05 DIAGNOSIS — M5412 Radiculopathy, cervical region: Secondary | ICD-10-CM

## 2019-03-05 DIAGNOSIS — M542 Cervicalgia: Secondary | ICD-10-CM

## 2019-03-05 DIAGNOSIS — R29898 Other symptoms and signs involving the musculoskeletal system: Secondary | ICD-10-CM

## 2019-03-06 NOTE — Telephone Encounter (Signed)
Novella [id number] (exp. 03/03/19 to 08/30/19) patient had MRI at GI on 03/05/19.

## 2019-03-30 ENCOUNTER — Other Ambulatory Visit: Payer: Self-pay

## 2019-03-30 ENCOUNTER — Ambulatory Visit: Payer: Managed Care, Other (non HMO) | Attending: Neurology | Admitting: Physical Therapy

## 2019-03-30 ENCOUNTER — Encounter: Payer: Self-pay | Admitting: Physical Therapy

## 2019-03-30 DIAGNOSIS — M542 Cervicalgia: Secondary | ICD-10-CM | POA: Insufficient documentation

## 2019-03-30 DIAGNOSIS — M79652 Pain in left thigh: Secondary | ICD-10-CM | POA: Insufficient documentation

## 2019-03-30 DIAGNOSIS — M25572 Pain in left ankle and joints of left foot: Secondary | ICD-10-CM | POA: Insufficient documentation

## 2019-03-30 NOTE — Therapy (Signed)
[place], [place], [address] Phone: [phone]   Fax:  [phone]  Physical Therapy Evaluation  Patient Details  Name: Benjamin Zimmerman MRN: [id number] Date of Birth: Sep 15, 1960 Referring Provider (PT): Erline Levine, MD    Encounter Date: 03/30/2019  PT End of Session - 03/30/19 0934    Visit Number  1    Number of Visits  7    Date for PT Re-Evaluation  05/11/19    Authorization Type  [employer]    PT Start Time  [phone]    PT Stop Time  0936    PT Time Calculation (min)  50 min    Activity Tolerance  Patient tolerated treatment well    Behavior During Therapy  [facility] for tasks assessed/performed       Past Medical History:  Diagnosis Date  . Arthralgia    LLE  . Kidney stones   . Plantar fasciitis    bilateral    Past Surgical History:  Procedure Laterality Date  . HERNIA REPAIR     as a child     There were no vitals filed for this visit.   Subjective Assessment - 03/30/19 0851    Subjective  pt presents with CC of loss of sensitivty in the L wrist/ hand which he reports increased with increaesd cervical flexion. He reports some pain inthe neck but feels like it is tired/ strained most of the time. He had been seen in PT previously for the LLE which he notes still is prsent but doesn't seem to be worsening.    How long can you sit comfortably?  not limited    How long can you stand comfortably?  not limited    How long can you walk comfortably?  not limited    Diagnostic tests  MRI 03/05/2019: impressio nThere are degenerative changes at C3-C4, C5-C6 and C6-C7 as detailed above.  There is mild to moderate right foraminal narrowing at C5-C6 and mild to moderate bilateral foraminal narrowing at C6-C7 but there does not appear to be any nerve root compression.  There is no spinal stenosis.    Currently in Pain?  Yes    Pain Score  2     Pain Location  Neck    Pain Orientation  Left;Right    Pain  Descriptors / Indicators  Tightness;Tingling    Pain Type  Chronic pain    Pain Radiating Towards  altered sensation in bil wrist/ hand L>R    Pain Onset  More than a month ago    Pain Frequency  Intermittent    Aggravating Factors   deep neck flexion    Pain Relieving Factors  ice and heat         [facility] PT Assessment - 03/30/19 0001      Assessment   Medical Diagnosis  Other cervical disc displacement at C6-C7 level M50.223, Cervicalgia M54.2    Referring Provider (PT)  Erline Levine, MD     Onset Date/Surgical Date  --   more than 5 years   Hand Dominance  Right    Next MD Visit  07/10/2019      Precautions   Precautions  None      Restrictions   Weight Bearing Restrictions  No      Balance Screen   Has the patient fallen in the past 6 months  No      [place] residence  Additional Comments  has stairs that dont really bother him      Prior Function   Level of Independence  Independent    Vocation  Full time employment    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, 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, hiking      Cognition   Overall Cognitive Status  Within Functional Limits for tasks assessed      Observation/Other Assessments   Focus on Therapeutic Outcomes (FOTO)   39% limited   predicted 30%     Posture/Postural Control   Posture/Postural Control  Postural limitations    Postural Limitations  Rounded Shoulders;Forward head      AROM   Cervical Flexion  50    Cervical Extension  68    Cervical - Right Side Bend  48    Cervical - Left Side Bend  48    Cervical - Right Rotation  67    Cervical - Left Rotation  73      Strength   Strength Assessment Site  Hand    Right Hand Grip (lbs)  99.3   97,100,101   Left Hand Grip (lbs)  83.3   78,86,86     Palpation   Palpation comment   TTP along cervical parapsinals specifically on the L aroudn C5-C7, and R upper trap with trigger points noted      Special Tests    Special  Tests  Cervical    Cervical Tests  Spurling's;other      Spurling's   Findings  Negative                Objective measurements completed on examination: See above findings.      [facility] Adult PT Treatment/Exercise - 03/30/19 0001      Exercises   Exercises  Neck      Neck Exercises: Supine   Neck Retraction  10 reps;5 secs   chin tuck and head lfit   Neck Retraction Limitations  cues to avoid excessive flexion during exercise      Modalities   Modalities  Traction      Traction   Type of Traction  Cervical    Min (lbs)  9    Max (lbs)  18    Hold Time  60    Rest Time  30    Time  10      Manual Therapy   Manual therapy comments  MTPR along the L cervical paraspinals x 2             PT Education - 03/30/19 0955    Education Details  evaluation findings, POC, goals, HEP and proper form, benefits of mechanical traction    Person(s) Educated  Patient    Methods  Explanation;Verbal cues;Handout    Comprehension  Verbalized understanding;Verbal cues required       PT Short Term Goals - 03/30/19 1001      PT SHORT TERM GOAL #1   Title  pt to be I with inital HEP    Time  3    Period  Weeks    Status  New    Target Date  04/20/19      PT SHORT TERM GOAL #2   Title  increase L grip strength by >/= 6# to demo improvement in condtion/ function    Time  3    Period  Weeks    Status  New    Target Date  04/20/19  PT Long Term Goals - 03/30/19 1001      PT LONG TERM GOAL #1   Title  pt to report decreased stiffness in the neck and no pain with AROM for ADLS and safety with driving    Time  6    Period  Weeks    Status  New    Target Date  05/11/19      PT LONG TERM GOAL #2   Title  pt to note reduced referred symptoms in bil wrist/ hands to promote improvement in condition and QOL    Time  6    Period  Weeks    Status  New    Target Date  05/11/19      PT LONG TERM GOAL #3   Title  pt to verbalize and demo proper posture and  lifting mechanics    Time  6    Period  Weeks    Status  New    Target Date  05/11/19      PT LONG TERM GOAL #4   Title  increase FOTO score to </= 30% limited to demo improvement in function    Time  6    Period  Weeks    Status  New    Target Date  05/11/19      PT LONG TERM GOAL #5   Title  pt to be I with all HEP given as of last visit to maintain and progress current level of function             Plan - 03/30/19 0935    Clinical Impression Statement  pt presents to OPPT with dx of C6-C7 HNP. He he has fucntional cervical ROM with mild tension noted at end ranges flexion/ extension and R rotation. TTP along cervical parapsinals specifically on the L around C5-C7. addressed stretching posterior cervical structures and strengthening to promote good posture. trialed mechanica traction emphasising that increaed traction doesn't always equat to increaed benefit. he would benefit from physical therapy to promote good posture and awareness, reduce cervical pain/ tension and bil UE referred symptoms by addressing the deficits listed.    Rehab Potential  Good    PT Frequency  1x / week    PT Duration  6 weeks    PT Treatment/Interventions  ADLs/Self Care Home Management;Cryotherapy;Electrical Stimulation;Iontophoresis 4mg /ml Dexamethasone;Moist Heat;Traction;Ultrasound;Therapeutic activities;Therapeutic exercise;Neuromuscular re-education;Manual techniques;Taping;Passive range of motion;Dry needling;Joint Manipulations;Spinal Manipulations    PT Next Visit Plan  review/ update HEP PRN, strengthening to promote good posture, trial DN for cervical parapsinals/ upper trap, how was mechanical traction,    PT Home Exercise Plan  Code: [email or web address] - chin tuck head lift, upper trap stretch, scapular retraction, proper sitting position, door way stretch    Consulted and Agree with Plan of Care  Patient       Patient will benefit from skilled therapeutic intervention in order to improve the  following deficits and impairments:  Decreased strength, Impaired flexibility, Pain, Difficulty walking, Increased muscle spasms  Visit Diagnosis: Cervicalgia     Problem List Patient Active Problem List   Diagnosis Date Noted  . Bilateral carpal tunnel syndrome 02/20/2019  . Left peroneal mononeuropathy 02/20/2019  . Numbness and tingling in both hands 02/18/2019  . Left leg pain 02/18/2019  . Peroneal neuropathy at knee, left 10/14/2018  . Ganglion of flexor tendon sheath of left middle finger 04/20/2017  . Anxiety and depression 02/27/2016  . Neuropathic pain of foot, left 09/10/2015  . Morning joint stiffness  09/10/2015  . Cervical disc disorder with radiculopathy of cervical region 04/10/2015  . Floater, vitreous 02/12/2015  . Disease of thyroid gland 02/12/2015  . High myopia 02/12/2015  . Hypothyroidism, postop 08/15/2014  . Malignant neoplasm of thyroid gland ([place]) 08/15/2014  . Postoperative hypothyroidism 08/15/2014  . Plantar fasciitis of left foot 05/16/2012  . Anemia 05/16/2012  . Calculus of kidney 06/06/2011   Starr Lake PT, DPT, LAT, ATC  03/30/19  10:54 AM      [facility] [address] [place], [place], [address] Phone: [phone]   Fax:  [phone]  Name: Benjamin Zimmerman MRN: [id number] Date of Birth: 1960-10-17

## 2019-04-05 ENCOUNTER — Other Ambulatory Visit: Payer: Self-pay

## 2019-04-05 ENCOUNTER — Ambulatory Visit: Payer: Managed Care, Other (non HMO) | Admitting: Physical Therapy

## 2019-04-05 ENCOUNTER — Encounter: Payer: Self-pay | Admitting: Physical Therapy

## 2019-04-05 DIAGNOSIS — M79652 Pain in left thigh: Secondary | ICD-10-CM

## 2019-04-05 DIAGNOSIS — M542 Cervicalgia: Secondary | ICD-10-CM

## 2019-04-05 DIAGNOSIS — M25572 Pain in left ankle and joints of left foot: Secondary | ICD-10-CM

## 2019-04-05 NOTE — Therapy (Signed)
[place], [place], [address] Phone: [phone]   Fax:  [phone]  Physical Therapy Treatment  Patient Details  Name: Benjamin Zimmerman MRN: [id number] Date of Birth: Mar 08, 1961 Referring Provider (PT): Erline Levine, MD    Encounter Date: 04/05/2019  PT End of Session - 04/05/19 0806    Visit Number  2    Number of Visits  7    Date for PT Re-Evaluation  05/11/19    Authorization Type  [employer]    PT Start Time  0805    PT Stop Time  [id number]    PT Time Calculation (min)  50 min    Activity Tolerance  Patient tolerated treatment well    Behavior During Therapy  [facility] for tasks assessed/performed       Past Medical History:  Diagnosis Date  . Arthralgia    LLE  . Kidney stones   . Plantar fasciitis    bilateral    Past Surgical History:  Procedure Laterality Date  . HERNIA REPAIR     as a child     There were no vitals filed for this visit.  Subjective Assessment - 04/05/19 0806    Subjective  My neck is tight, morediscomfort than pain.    Currently in Pain?  Yes    Pain Score  2     Pain Location  Neck    Pain Orientation  Left                       [facility] Adult PT Treatment/Exercise - 04/05/19 0001      Traction   Min (lbs)  9    Max (lbs)  18    Hold Time  60    Rest Time  30    Time  10      Manual Therapy   Manual therapy comments  skilled palpation and monitoring during TPDN    Joint Mobilization  prone cervical unilaterals bilaterally, upper thoracic PA    Soft tissue mobilization  suboccipitals, Lt upper trap, levator, bil pec major       Trigger Point Dry Needling - 04/05/19 0001    Upper Trapezius Response  Twitch reponse elicited;Palpable increased muscle length   Left          PT Education - 04/05/19 1518    Education Details  treatment rationale- adding TPDN to traction    Person(s) Educated  Patient    Methods  Explanation    Comprehension   Verbalized understanding;Need further instruction       PT Short Term Goals - 03/30/19 1001      PT SHORT TERM GOAL #1   Title  pt to be I with inital HEP    Time  3    Period  Weeks    Status  New    Target Date  04/20/19      PT SHORT TERM GOAL #2   Title  increase L grip strength by >/= 6# to demo improvement in condtion/ function    Time  3    Period  Weeks    Status  New    Target Date  04/20/19        PT Long Term Goals - 03/30/19 1001      PT LONG TERM GOAL #1   Title  pt to report decreased stiffness in the neck and no pain with AROM for ADLS and safety with driving  Time  6    Period  Weeks    Status  New    Target Date  05/11/19      PT LONG TERM GOAL #2   Title  pt to note reduced referred symptoms in bil wrist/ hands to promote improvement in condition and QOL    Time  6    Period  Weeks    Status  New    Target Date  05/11/19      PT LONG TERM GOAL #3   Title  pt to verbalize and demo proper posture and lifting mechanics    Time  6    Period  Weeks    Status  New    Target Date  05/11/19      PT LONG TERM GOAL #4   Title  increase FOTO score to </= 30% limited to demo improvement in function    Time  6    Period  Weeks    Status  New    Target Date  05/11/19      PT LONG TERM GOAL #5   Title  pt to be I with all HEP given as of last visit to maintain and progress current level of function            Plan - 04/05/19 0843    Clinical Impression Statement  Minimal change from traction at eval. Added TPDN to decrease trigger points and excessive tightness in upper traps and manual therapy to lengthen pec major bilaterally. Significant anterior tightness resulting in pull of forward rounded shoulders. Will have massage therapist work on anterior lengthening.    PT Treatment/Interventions  ADLs/Self Care Home Management;Cryotherapy;Electrical Stimulation;Iontophoresis 4mg /ml Dexamethasone;Moist Heat;Traction;Ultrasound;Therapeutic  activities;Therapeutic exercise;Neuromuscular re-education;Manual techniques;Taping;Passive range of motion;Dry needling;Joint Manipulations;Spinal Manipulations    PT Next Visit Plan  DN to pecs, any change with second traction?    PT Home Exercise Plan  Code: [email or web address] - chin tuck head lift, upper trap stretch, scapular retraction, proper sitting position, door way stretch    Consulted and Agree with Plan of Care  Patient       Patient will benefit from skilled therapeutic intervention in order to improve the following deficits and impairments:  Decreased strength, Impaired flexibility, Pain, Difficulty walking, Increased muscle spasms  Visit Diagnosis: Cervicalgia  Pain in left ankle and joints of left foot  Pain in left thigh     Problem List Patient Active Problem List   Diagnosis Date Noted  . Bilateral carpal tunnel syndrome 02/20/2019  . Left peroneal mononeuropathy 02/20/2019  . Numbness and tingling in both hands 02/18/2019  . Left leg pain 02/18/2019  . Peroneal neuropathy at knee, left 10/14/2018  . Ganglion of flexor tendon sheath of left middle finger 04/20/2017  . Anxiety and depression 02/27/2016  . Neuropathic pain of foot, left 09/10/2015  . Morning joint stiffness 09/10/2015  . Cervical disc disorder with radiculopathy of cervical region 04/10/2015  . Floater, vitreous 02/12/2015  . Disease of thyroid gland 02/12/2015  . High myopia 02/12/2015  . Hypothyroidism, postop 08/15/2014  . Malignant neoplasm of thyroid gland ([place]) 08/15/2014  . Postoperative hypothyroidism 08/15/2014  . Plantar fasciitis of left foot 05/16/2012  . Anemia 05/16/2012  . Calculus of kidney 06/06/2011    Jaleena Viviani C. Grabiela Wohlford PT, DPT 04/05/19 3:21 PM   [facility] [address] [place], [place], [address] Phone: [phone]   Fax:  [phone]  Name: Benjamin Zimmerman MRN: [id number] Date of Birth:  03/09/1961   

## 2019-04-12 ENCOUNTER — Other Ambulatory Visit: Payer: Self-pay

## 2019-04-12 ENCOUNTER — Ambulatory Visit: Payer: Managed Care, Other (non HMO) | Admitting: Physical Therapy

## 2019-04-12 ENCOUNTER — Encounter: Payer: Self-pay | Admitting: Physical Therapy

## 2019-04-12 DIAGNOSIS — M542 Cervicalgia: Secondary | ICD-10-CM

## 2019-04-12 DIAGNOSIS — M25572 Pain in left ankle and joints of left foot: Secondary | ICD-10-CM

## 2019-04-12 DIAGNOSIS — M79652 Pain in left thigh: Secondary | ICD-10-CM

## 2019-04-12 NOTE — Therapy (Signed)
[place], [place], [address] Phone: [phone]   Fax:  [phone]  Physical Therapy Treatment  Patient Details  Name: Benjamin Zimmerman MRN: [id number] Date of Birth: [birth year] Referring Provider (PT): Erline Levine, MD    Encounter Date: 04/12/2019  PT End of Session - 04/12/19 0847    Visit Number  3    Number of Visits  7    Date for PT Re-Evaluation  05/11/19    Authorization Type  [employer]    PT Start Time  0804    PT Stop Time  0845    PT Time Calculation (min)  41 min    Activity Tolerance  Patient tolerated treatment well    Behavior During Therapy  [facility] for tasks assessed/performed       Past Medical History:  Diagnosis Date  . Arthralgia    LLE  . Kidney stones   . Plantar fasciitis    bilateral    Past Surgical History:  Procedure Laterality Date  . HERNIA REPAIR     as a child     There were no vitals filed for this visit.  Subjective Assessment - 04/12/19 0808    Subjective  Soreness in bil upper traps, but decreased neck tightness. The numbness in my hands is improved    Patient Stated Goals  less pain and cramping at night, [place]                       [facility] Adult PT Treatment/Exercise - 04/12/19 0001      Neck Exercises: Supine   Shoulder Flexion Limitations  supine on foam roll- cues for rib cage flare lock    Other Supine Exercise  ab set to hold rib cage flare with deep breathing through upper lung lobes    Other Supine Exercise  UE horiz abd yellow tband on foam roller, ab set on foam roller with NBOS & marching      Manual Therapy   Joint Mobilization  prone thoracic PA, Rt rib isometric gr 4 hold with breathing      Neck Exercises: Stretches   Other Neck Stretches  thoracic extension over half foam roll             PT Education - 04/12/19 1129    Education Details  bike posture    Person(s) Educated  Patient     Methods  Explanation    Comprehension  Verbalized understanding       PT Short Term Goals - 03/30/19 1001      PT SHORT TERM GOAL #1   Title  pt to be I with inital HEP    Time  3    Period  Weeks    Status  New    Target Date  04/20/19      PT SHORT TERM GOAL #2   Title  increase L grip strength by >/= 6# to demo improvement in condtion/ function    Time  3    Period  Weeks    Status  New    Target Date  04/20/19        PT Long Term Goals - 03/30/19 1001      PT LONG TERM GOAL #1   Title  pt to report decreased stiffness in the neck and no pain with AROM for ADLS and safety with driving    Time  6  Period  Weeks    Status  New    Target Date  05/11/19      PT LONG TERM GOAL #2   Title  pt to note reduced referred symptoms in bil wrist/ hands to promote improvement in condition and QOL    Time  6    Period  Weeks    Status  New    Target Date  05/11/19      PT LONG TERM GOAL #3   Title  pt to verbalize and demo proper posture and lifting mechanics    Time  6    Period  Weeks    Status  New    Target Date  05/11/19      PT LONG TERM GOAL #4   Title  increase FOTO score to </= 30% limited to demo improvement in function    Time  6    Period  Weeks    Status  New    Target Date  05/11/19      PT LONG TERM GOAL #5   Title  pt to be I with all HEP given as of last visit to maintain and progress current level of function            Plan - 04/12/19 0942    Clinical Impression Statement  Utilized foam roll for postural extension exercises and core stability. Cues to use core to anchor lower rib cage and breathe through upper ribs to stretch pecs. We discussed postures while cycling and I offered suggestions to change position which he will try at home.    PT Treatment/Interventions  ADLs/Self Care Home Management;Cryotherapy;Electrical Stimulation;Iontophoresis 4mg /ml Dexamethasone;Moist Heat;Traction;Ultrasound;Therapeutic activities;Therapeutic  exercise;Neuromuscular re-education;Manual techniques;Taping;Passive range of motion;Dry needling;Joint Manipulations;Spinal Manipulations    PT Next Visit Plan  outcome of traction vs no? end of insurance availability for PT?    PT Home Exercise Plan  Code: [id number] - chin tuck head lift, upper trap stretch, scapular retraction, proper sitting position, door way stretch    Consulted and Agree with Plan of Care  Patient       Patient will benefit from skilled therapeutic intervention in order to improve the following deficits and impairments:  Decreased strength, Impaired flexibility, Pain, Difficulty walking, Increased muscle spasms  Visit Diagnosis: Cervicalgia  Pain in left ankle and joints of left foot  Pain in left thigh     Problem List Patient Active Problem List   Diagnosis Date Noted  . Bilateral carpal tunnel syndrome 02/20/2019  . Left peroneal mononeuropathy 02/20/2019  . Numbness and tingling in both hands 02/18/2019  . Left leg pain 02/18/2019  . Peroneal neuropathy at knee, left 10/14/2018  . Ganglion of flexor tendon sheath of left middle finger 04/20/2017  . Anxiety and depression 02/27/2016  . Neuropathic pain of foot, left 09/10/2015  . Morning joint stiffness 09/10/2015  . Cervical disc disorder with radiculopathy of cervical region 04/10/2015  . Floater, vitreous 02/12/2015  . Disease of thyroid gland 02/12/2015  . High myopia 02/12/2015  . Hypothyroidism, postop 08/15/2014  . Malignant neoplasm of thyroid gland ([place]) 08/15/2014  . Postoperative hypothyroidism 08/15/2014  . Plantar fasciitis of left foot 05/16/2012  . Anemia 05/16/2012  . Calculus of kidney 06/06/2011    Kiamesha Samet C. Sang Blount PT, DPT 04/12/19 11:29 AM   [place] [facility] [address] [place], [place], [address] Phone: [phone]   Fax:  [phone]  Name: SKAI FARAR MRN: [id number] Date of Birth: 1961-02-05

## 2019-04-19 ENCOUNTER — Other Ambulatory Visit: Payer: Self-pay

## 2019-04-19 ENCOUNTER — Encounter: Payer: Self-pay | Admitting: Physical Therapy

## 2019-04-19 ENCOUNTER — Ambulatory Visit: Payer: Managed Care, Other (non HMO) | Admitting: Physical Therapy

## 2019-04-19 DIAGNOSIS — M25572 Pain in left ankle and joints of left foot: Secondary | ICD-10-CM

## 2019-04-19 DIAGNOSIS — M542 Cervicalgia: Secondary | ICD-10-CM | POA: Diagnosis not present

## 2019-04-19 DIAGNOSIS — M79652 Pain in left thigh: Secondary | ICD-10-CM

## 2019-04-19 NOTE — Therapy (Addendum)
[place], [place], [address] Phone: [phone]   Fax:  [phone]  Physical Therapy Treatment  Patient Details  Name: Benjamin Zimmerman MRN: [id number] Date of Birth: 1960/06/15 Referring Provider (PT): Erline Levine, MD    Encounter Date: 04/19/2019  PT End of Session - 04/19/19 0808    Visit Number  4    Number of Visits  7    Date for PT Re-Evaluation  05/11/19    Authorization Type  [employer]    PT Start Time  0804    PT Stop Time  0370    PT Time Calculation (min)  43 min    Activity Tolerance  Patient tolerated treatment well    Behavior During Therapy  [facility] for tasks assessed/performed       Past Medical History:  Diagnosis Date  . Arthralgia    LLE  . Kidney stones   . Plantar fasciitis    bilateral    Past Surgical History:  Procedure Laterality Date  . HERNIA REPAIR     as a child     There were no vitals filed for this visit.  Subjective Assessment - 04/19/19 0806    Subjective  Tried to tighten core more and tried correcting bike posture. Neck feels about the same. I think the numbness in my hands is less.    Patient Stated Goals  less pain and cramping at night, be able to [place]    Currently in Pain?  Yes    Pain Location  Neck         [facility] PT Assessment - 04/19/19 0001      Assessment   Medical Diagnosis  Other cervical disc displacement at C6-C7 level M50.223, Cervicalgia M54.2    Referring Provider (PT)  Erline Levine, MD       Observation/Other Assessments   Focus on Therapeutic Outcomes (FOTO)   29% limited      AROM   Cervical Flexion  50    Cervical Extension  55    Cervical - Right Side Bend  32    Cervical - Left Side Bend  34    Cervical - Right Rotation  72    Cervical - Left Rotation  74      Strength   Right Hand Grip (lbs)  95    Left Hand Grip (lbs)  85                   [facility] Adult PT Treatment/Exercise -  04/19/19 0001      Traction   Min (lbs)  5    Max (lbs)  15    Hold Time  60    Rest Time  20    Time  15      Manual Therapy   Soft tissue mobilization  bil upper traps seated             PT Education - 04/19/19 0905    Education Details  goals, POC, importance of HEP    Person(s) Educated  Patient    Methods  Explanation    Comprehension  Verbalized understanding       PT Short Term Goals - 04/19/19 0912      PT SHORT TERM GOAL #1   Title  pt to be I with inital HEP    Status  Achieved      PT SHORT TERM GOAL #2   Title  increase L grip strength by >/= 6# to demo improvement in condtion/ function    Status  Achieved        PT Long Term Goals - 04/19/19 6269      PT LONG TERM GOAL #1   Title  pt to report decreased stiffness in the neck and no pain with AROM for ADLS and safety with driving    Baseline  occasionally but not usually    Status  Partially Met      PT LONG TERM GOAL #2   Title  pt to note reduced referred symptoms in bil wrist/ hands to promote improvement in condition and QOL    Baseline  decreasing slowly    Status  On-going      PT LONG TERM GOAL #3   Title  pt to verbalize and demo proper posture and lifting mechanics    Status  Achieved      PT LONG TERM GOAL #4   Title  increase FOTO score to </= 30% limited to demo improvement in function    Baseline  29% limited    Status  Achieved      PT LONG TERM GOAL #5   Title  pt to be I with all HEP given as of last visit to maintain and progress current level of function    Status  Achieved            Plan - 04/19/19 0908    Clinical Impression Statement  pt is demonstrating progress but continues to experience tightness in cervical region with some numbness in hands- has decreased overall. I do believe he would benefit from having a home cervical traction unit for long term use. We discussed the importance of continuing with HEP to decrease strain from poor postures while working  and while biking. Will d/c today as he is able to be independent with HEP. He has 4 visits left to use in PT per insurance and will hold on to those touse PRN until March when his insurance resets.    PT Treatment/Interventions  ADLs/Self Care Home Management;Cryotherapy;Electrical Stimulation;Iontophoresis 39m/ml Dexamethasone;Moist Heat;Traction;Ultrasound;Therapeutic activities;Therapeutic exercise;Neuromuscular re-education;Manual techniques;Taping;Passive range of motion;Dry needling;Joint Manipulations;Spinal Manipulations    PT Home Exercise Plan  Code: [id number] - chin tuck head lift, upper trap stretch, scapular retraction, proper sitting position, door way stretch    Consulted and Agree with Plan of Care  Patient       Patient will benefit from skilled therapeutic intervention in order to improve the following deficits and impairments:  Decreased strength, Impaired flexibility, Pain, Difficulty walking, Increased muscle spasms  Visit Diagnosis: Cervicalgia  Pain in left ankle and joints of left foot  Pain in left thigh     Problem List Patient Active Problem List   Diagnosis Date Noted  . Bilateral carpal tunnel syndrome 02/20/2019  . Left peroneal mononeuropathy 02/20/2019  . Numbness and tingling in both hands 02/18/2019  . Left leg pain 02/18/2019  . Peroneal neuropathy at knee, left 10/14/2018  . Ganglion of flexor tendon sheath of left middle finger 04/20/2017  . Anxiety and depression 02/27/2016  . Neuropathic pain of foot, left 09/10/2015  . Morning joint stiffness 09/10/2015  . Cervical disc disorder with radiculopathy of cervical region 04/10/2015  . Floater, vitreous 02/12/2015  . Disease of thyroid gland 02/12/2015  . High myopia 02/12/2015  . Hypothyroidism, postop 08/15/2014  . [place] 08/15/2014  . Postoperative hypothyroidism 08/15/2014  . Plantar fasciitis of  left foot 05/16/2012  . Anemia 05/16/2012  . Calculus of  kidney 06/06/2011    Raynald Rouillard C. Jadin Kagel PT, DPT 04/19/19 12:18 PM   [place] [facility] [address] [place], [place], [address] Phone: [phone]   Fax:  [phone]  Name: Benjamin Zimmerman MRN: [id number] Date of Birth: 1960-06-16

## 2019-04-26 ENCOUNTER — Ambulatory Visit: Payer: Managed Care, Other (non HMO) | Admitting: Physical Therapy

## 2019-09-01 DIAGNOSIS — L821 Other seborrheic keratosis: Secondary | ICD-10-CM | POA: Diagnosis not present

## 2019-09-01 DIAGNOSIS — D2339 Other benign neoplasm of skin of other parts of face: Secondary | ICD-10-CM | POA: Diagnosis not present

## 2019-09-01 DIAGNOSIS — Z1283 Encounter for screening for malignant neoplasm of skin: Secondary | ICD-10-CM | POA: Diagnosis not present

## 2019-12-01 DIAGNOSIS — Z0000 Encounter for general adult medical examination without abnormal findings: Secondary | ICD-10-CM | POA: Diagnosis not present

## 2019-12-01 DIAGNOSIS — D649 Anemia, unspecified: Secondary | ICD-10-CM | POA: Diagnosis not present

## 2019-12-01 DIAGNOSIS — E559 Vitamin D deficiency, unspecified: Secondary | ICD-10-CM | POA: Diagnosis not present

## 2019-12-01 DIAGNOSIS — R0789 Other chest pain: Secondary | ICD-10-CM | POA: Diagnosis not present

## 2019-12-01 DIAGNOSIS — Z125 Encounter for screening for malignant neoplasm of prostate: Secondary | ICD-10-CM | POA: Diagnosis not present

## 2019-12-01 DIAGNOSIS — Z13220 Encounter for screening for lipoid disorders: Secondary | ICD-10-CM | POA: Diagnosis not present

## 2019-12-21 DIAGNOSIS — R946 Abnormal results of thyroid function studies: Secondary | ICD-10-CM | POA: Diagnosis not present

## 2020-02-20 DIAGNOSIS — E890 Postprocedural hypothyroidism: Secondary | ICD-10-CM | POA: Diagnosis not present

## 2020-02-27 DIAGNOSIS — Z23 Encounter for immunization: Secondary | ICD-10-CM | POA: Diagnosis not present

## 2020-03-15 DIAGNOSIS — C73 Malignant neoplasm of thyroid gland: Secondary | ICD-10-CM | POA: Diagnosis not present

## 2020-03-15 DIAGNOSIS — E890 Postprocedural hypothyroidism: Secondary | ICD-10-CM | POA: Diagnosis not present

## 2020-03-26 DIAGNOSIS — I781 Nevus, non-neoplastic: Secondary | ICD-10-CM | POA: Diagnosis not present

## 2020-03-26 DIAGNOSIS — D1801 Hemangioma of skin and subcutaneous tissue: Secondary | ICD-10-CM | POA: Diagnosis not present

## 2020-05-23 DIAGNOSIS — E890 Postprocedural hypothyroidism: Secondary | ICD-10-CM | POA: Diagnosis not present

## 2020-08-29 ENCOUNTER — Ambulatory Visit (INDEPENDENT_AMBULATORY_CARE_PROVIDER_SITE_OTHER): Payer: BC Managed Care – PPO | Admitting: Family Medicine

## 2020-08-29 ENCOUNTER — Other Ambulatory Visit: Payer: Self-pay

## 2020-08-29 VITALS — BP 122/78 | Ht 70.0 in | Wt 165.0 lb

## 2020-08-29 DIAGNOSIS — M79671 Pain in right foot: Secondary | ICD-10-CM | POA: Diagnosis not present

## 2020-08-29 MED ORDER — MELOXICAM 15 MG PO TABS: ORAL_TABLET | ORAL | 0 refills | Status: DC

## 2020-08-29 NOTE — Progress Notes (Signed)
Office Visit Note   Patient: Benjamin Zimmerman           Date of Birth: 1960/08/12           MRN: [id number] Visit Date: 08/29/2020 Requested by: Vernie Shanks, MD [place],  [place] [address] PCP: Vernie Shanks, MD  Subjective: CC: Right fifth metatarsal pain  HPI: 60 year old male presenting to clinic with concerns of "several months" of right fifth metatarsal pain.  Patient states that he is an avid cyclist, but there has been no significant change in his cycling routine prior to the onset of the symptoms.  He does have a history of plantar fasciitis, and says that he has been trying to self treat this with 50 calf raises bilaterally daily.  He has been doing these calf raises for over a year, but thinks they might have been contributing to his fifth toe pain.  A week ago, he decided to pause these calf raises, but he has not yet noticed any changes in his fifth toe pain.  He denies any trauma, no swelling of the toe or midfoot.  He has difficulty pinpointing the exact location of his pain today, but feels that it is in the proximal aspect of his fifth metatarsal.  He says he was worried that he may have a stress fracture, and decided to come in for evaluation.  Per patient "I am one of those guys that avoids doctors, I tend to just hope everything will get better on its own."  No pain in his contralateral foot.  He says his pain is not significantly worsened by walking, but "I can tell it is there."  Has not tried any over-the-counter pain medications.              ROS:   All other systems were reviewed and are negative.  Objective: Vital Signs: BP 122/78   Ht 5\' 10"  (1.778 m)   Wt 165 lb (74.8 kg)   BMI 23.68 kg/m  [facility] Adult Exercise 08/29/2020  Frequency of aerobic exercise (# of days/week) 6  Average time in minutes 60  Frequency of strengthening activities (# of days/week) 4     No flowsheet data found.  Physical Exam:  General:  Alert and  oriented, in no acute distress. Pulm:  Breathing unlabored. Psy:  Normal mood, congruent affect. Skin: Right foot without bruises, rashes, or erythema. Overlying skin intact.   Right foot exam: General assessment: Normal Gait.  Inspection: No significant pes planus or cavus. Normal posterior tibialis function with feel raise.   No significant swelling or deformity.  Seated Exam: Full ROM in ankle and all toes without pain.   Palpation: Endorses tenderness to palpation over the proximal aspect of the 5th metatarsal- though this is not pinpoint and somewhat hard to localize. No pain with calf or Achilles squeeze.  No Tenderness over the peroneal tendons. No bony tenderness to palpation over the cuboid, navicular, or elsewbere within midfoot.  Ligamentous Examination:  No Tenderness over medial or lateral ligament complexes.   No Tenderness over the Anterior joint line  Strength: 5/5 in eversion (Mildly uncomfortable), inversion, plantarflexion, and dorsiflexion (A little uncomfortable).  Normal distal sensation   Imaging: Limited Extremity Ultrasound:  Right metatarsal with no obvious cortical irregularities. Does have trace fluid around peroneus brevis at it inserts at 5th metatarsal, though no fluid elsewhere along the peroneal tendons.  5th metatarsal without significantly increased doppler flow.  Assessment & Plan: 60 year old male presenting to clinic with concerns of right fifth metatarsal pain.  Ultrasound with no obvious evidence of stress fracture, though does appear to have some irritation at the peroneus brevis insertion site.  Suspect this is likely due to overuse with his aggressive daily calf raises. -Avoid calf raises for the next 2 to 3 weeks, and will provide meloxicam to help with this inflammation. -Wear stable shoes to help cushion the fifth metatarsal. -After relative rest, will start rehabilitation for peroneal tendons. -If pain does not improve with meloxicam  and relative rest, return to clinic for reevaluation. -Patient was agreeable with plan.  He had no further questions or concerns today.   I was the preceptor for this visit and available for immediate consultation Shellia Cleverly, DO

## 2020-09-25 DIAGNOSIS — E890 Postprocedural hypothyroidism: Secondary | ICD-10-CM | POA: Diagnosis not present

## 2020-10-05 ENCOUNTER — Other Ambulatory Visit: Payer: Self-pay | Admitting: Family Medicine

## 2020-10-17 ENCOUNTER — Other Ambulatory Visit: Payer: Self-pay

## 2020-10-17 MED ORDER — MELOXICAM 15 MG PO TABS: 15.0000 mg | ORAL_TABLET | Freq: Every day | ORAL | 0 refills | Status: DC | PRN

## 2020-10-17 NOTE — Addendum Note (Signed)
Addended by: Jolinda Croak E on: 10/17/2020 02:08 PM   Modules accepted: Orders

## 2020-11-20 IMAGING — CR LEFT KNEE - COMPLETE 4+ VIEW
4 series · 4 of 4 positions shown · non-contrast
Comparison: None.

CLINICAL DATA: Neuropathy.  No known injury.

EXAM:
LEFT KNEE - COMPLETE 4+ VIEW

[t knee ap left (1 of 3)]
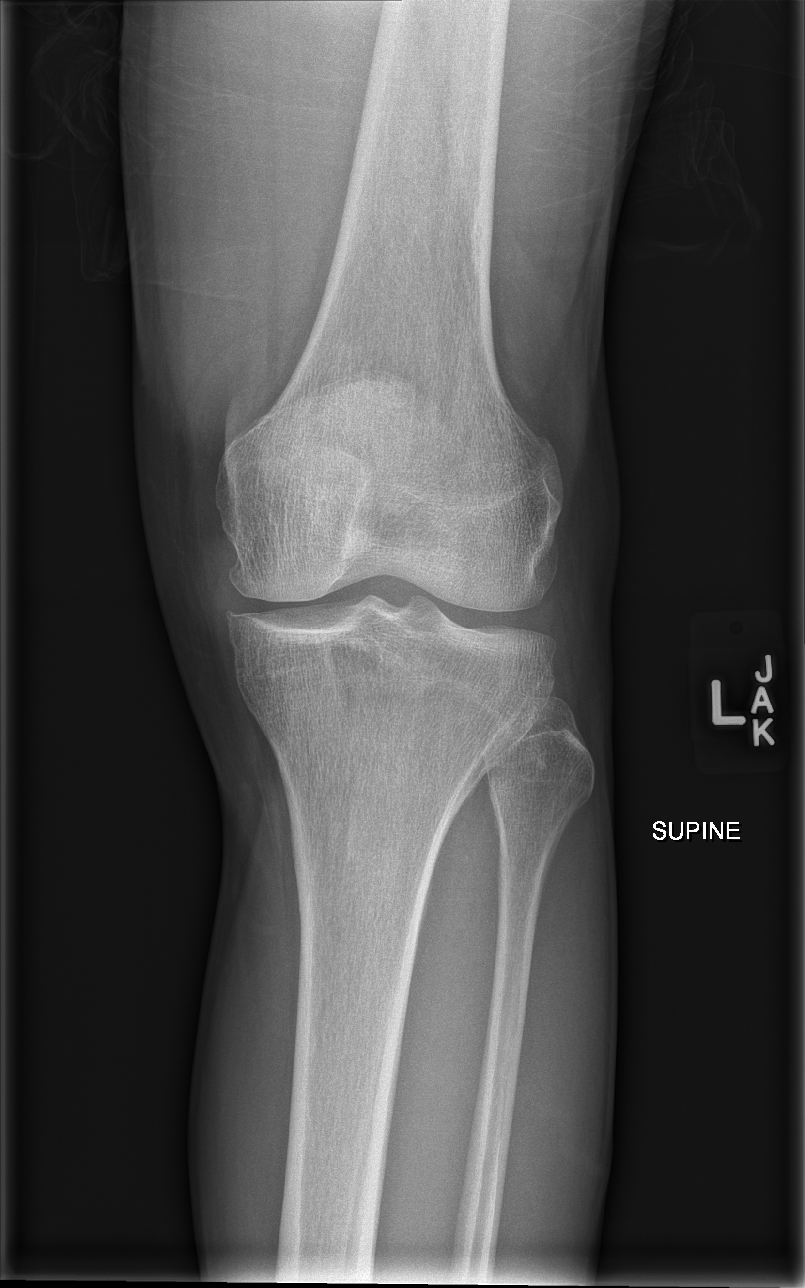

[t knee ap left (2 of 3)]
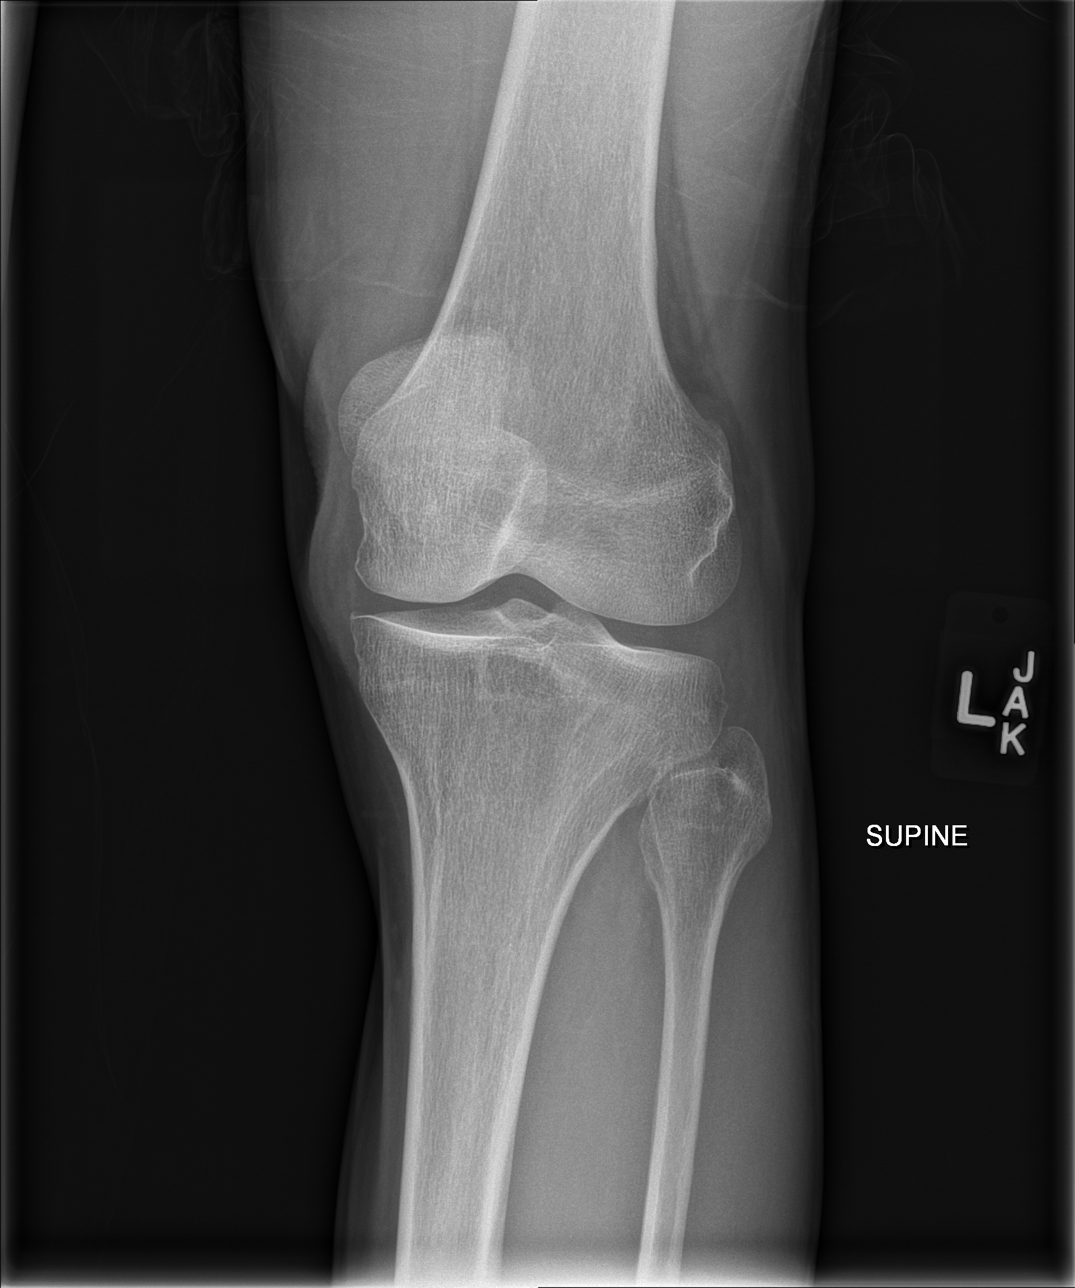

[t knee ap left (3 of 3)]
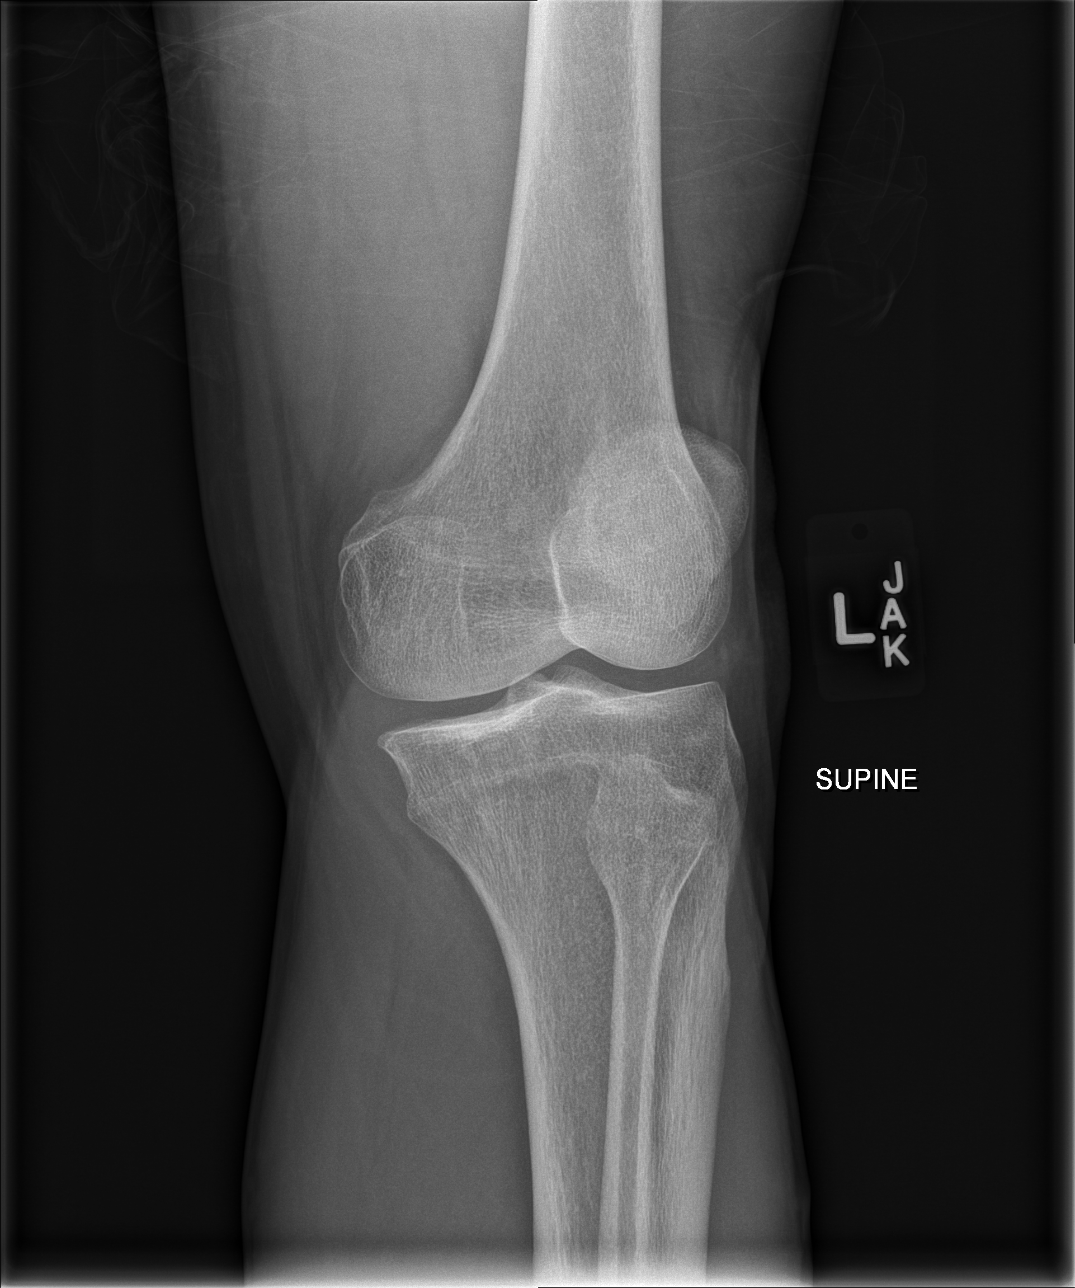

[t knee lat left]
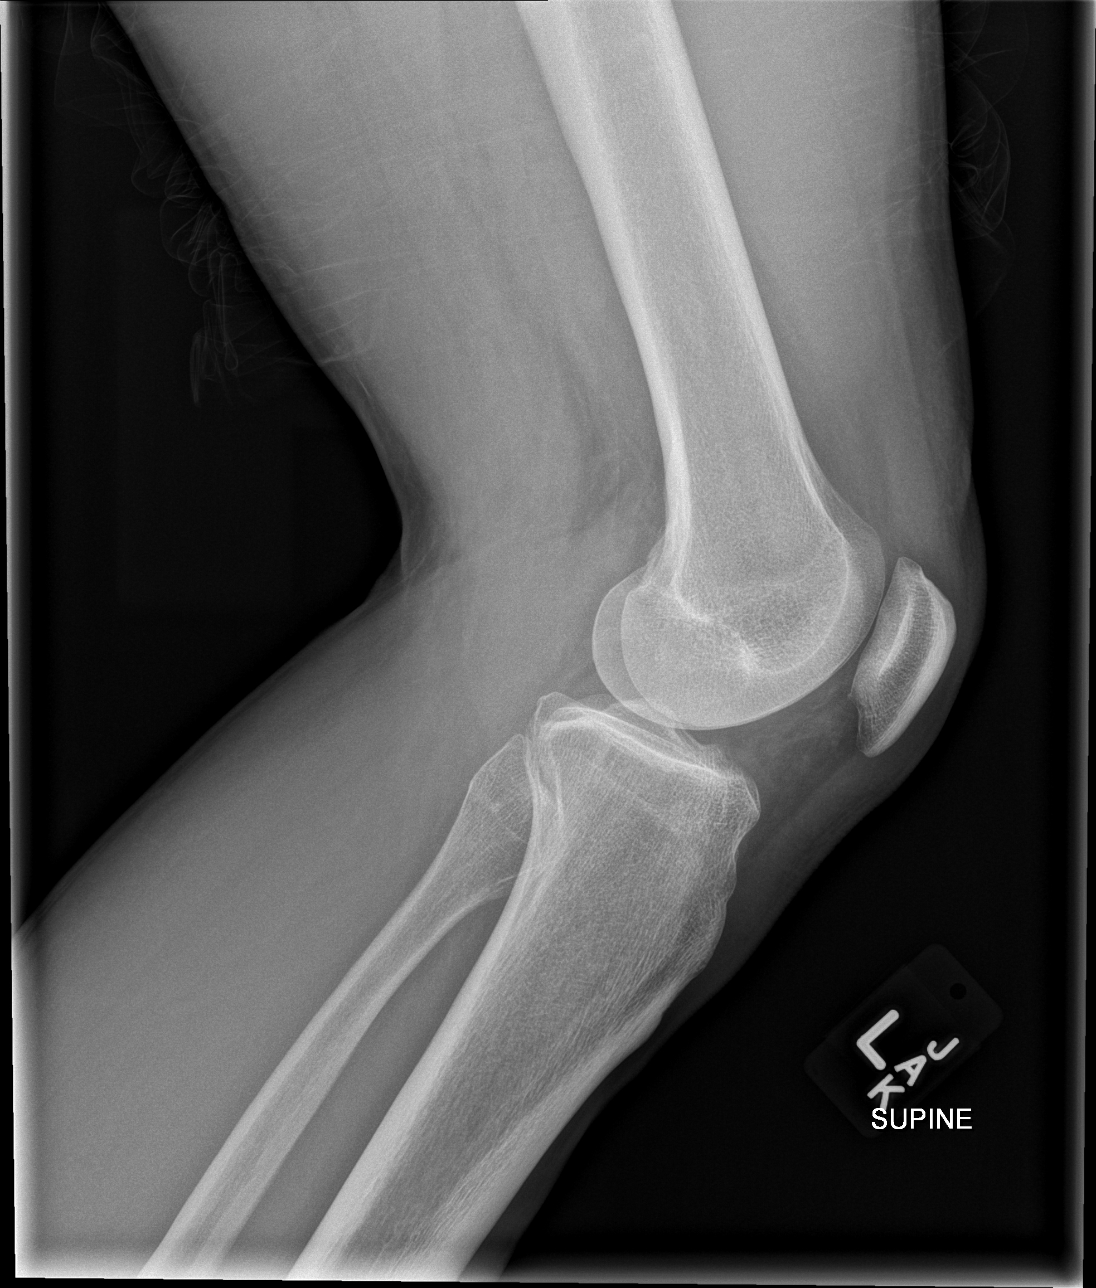

[4 of 4 positions shown; findings below may reference images not displayed]

FINDINGS: No evidence of fracture, dislocation, or joint effusion. No evidence
of arthropathy or other focal bone abnormality. Soft tissues are
unremarkable.
IMPRESSION: Negative.

## 2020-12-02 DIAGNOSIS — Z125 Encounter for screening for malignant neoplasm of prostate: Secondary | ICD-10-CM | POA: Diagnosis not present

## 2020-12-02 DIAGNOSIS — Z0000 Encounter for general adult medical examination without abnormal findings: Secondary | ICD-10-CM | POA: Diagnosis not present

## 2020-12-02 DIAGNOSIS — E039 Hypothyroidism, unspecified: Secondary | ICD-10-CM | POA: Diagnosis not present

## 2020-12-02 DIAGNOSIS — E559 Vitamin D deficiency, unspecified: Secondary | ICD-10-CM | POA: Diagnosis not present

## 2020-12-02 DIAGNOSIS — Z13220 Encounter for screening for lipoid disorders: Secondary | ICD-10-CM | POA: Diagnosis not present

## 2020-12-02 DIAGNOSIS — D649 Anemia, unspecified: Secondary | ICD-10-CM | POA: Diagnosis not present

## 2021-01-13 ENCOUNTER — Telehealth: Payer: Self-pay | Admitting: Internal Medicine

## 2021-01-13 NOTE — Telephone Encounter (Signed)
Please send records to my office and I will review Thanks JMP

## 2021-01-13 NOTE — Telephone Encounter (Signed)
Hey Dr. Hilarie Fredrickson,   Patient called in to transfer care to you as his provider for colonoscopy. Last colonoscopy 03/2012. Patient is not having any GI issues. I have records. Could you please review and advise on scheduling?  Thank you

## 2021-02-06 ENCOUNTER — Encounter: Payer: Self-pay | Admitting: Internal Medicine

## 2021-03-05 ENCOUNTER — Ambulatory Visit (INDEPENDENT_AMBULATORY_CARE_PROVIDER_SITE_OTHER): Payer: BC Managed Care – PPO | Admitting: Sports Medicine

## 2021-03-05 ENCOUNTER — Ambulatory Visit (AMBULATORY_SURGERY_CENTER): Payer: BC Managed Care – PPO

## 2021-03-05 ENCOUNTER — Other Ambulatory Visit: Payer: Self-pay

## 2021-03-05 VITALS — BP 120/70 | Ht 70.0 in | Wt 165.0 lb

## 2021-03-05 VITALS — Ht 70.0 in | Wt 165.0 lb

## 2021-03-05 DIAGNOSIS — Z1211 Encounter for screening for malignant neoplasm of colon: Secondary | ICD-10-CM

## 2021-03-05 DIAGNOSIS — M2141 Flat foot [pes planus] (acquired), right foot: Secondary | ICD-10-CM | POA: Diagnosis not present

## 2021-03-05 DIAGNOSIS — M7741 Metatarsalgia, right foot: Secondary | ICD-10-CM

## 2021-03-05 DIAGNOSIS — M2142 Flat foot [pes planus] (acquired), left foot: Secondary | ICD-10-CM | POA: Diagnosis not present

## 2021-03-05 DIAGNOSIS — M7671 Peroneal tendinitis, right leg: Secondary | ICD-10-CM | POA: Diagnosis not present

## 2021-03-05 MED ORDER — PEG-KCL-NACL-NASULF-NA ASC-C 100 G PO SOLR: 1.0000 | Freq: Once | ORAL | 0 refills | Status: AC

## 2021-03-05 MED ORDER — DICLOFENAC SODIUM 1 % EX GEL: 4.0000 g | Freq: Four times a day (QID) | CUTANEOUS | 2 refills | Status: AC

## 2021-03-05 NOTE — Progress Notes (Signed)

## 2021-03-05 NOTE — Patient Instructions (Addendum)
It was great to meet you today, thank you for letting me participate in your care!  Today, we discussed your right foot - 2 components going on: metatarsalgia and peroneal tendonitis/tendinopathy.  -We will give you 2 metatarsal pads to place in your shoes that you wear daily -You may ice/heat both of the painful areas -Perform home physical therapy exercises for the peroneal tendons - Voltaren gel 3-4x daily over painful areas  -Ultimately, I think he would benefit from a trial of orthotics or we can add the pad both of the metatarsals and the fifth metatarsal peroneal tendons. If you are not doing better at follow-up, this is likely a direction we will go. Another option is trying an injection in that area, but would like to try to do conservative management first.  You will follow-up about 3 weeks if not improving.  Video of placing metatarsal pad: [email or web address]   If you have any further questions, please give the clinic a call [phone].  [place],  Elba Barman, DO PGY-4, Sports Medicine Fellow [place]

## 2021-03-05 NOTE — Progress Notes (Signed)
PCP: Vernie Shanks, MD  Subjective:   HPI: Patient is a 60 y.o. male here for f/u of his right lateral foot pain.  He was seen in clinic back in April of 2022 for the same issue. Based on his exam and limited U/S evaluation it was determined he had peroneal (brevis) tendonitis with some fluid surrounding the tendon over the base of 5th metatarsal. The patient has long history of plantar fasciitis, so had been doing a lot of calf raises in the past which aggravated. He was treated with Mobic and activity modification at that time.  Today, he states the pain continues but it is low - maybe 2 or 3/10. It has waxed and waned since April, but has not prevented him from doing his activities such as biking (he cycles daily), although he does avoid running or very long walks. The pain is located over the base of the 5th metatarsal. It does not bother him at rest. He states that he has a history of flat feet and has tried many OTC orthotics and believes Dr. Oneida Alar may have made him orthotics many years ago - unsure how much relief he got from these.  Recently he was very active with yard work and loading trees with a family member that he has brought on some metatarsalgia in the same foot. His pain is worse with ambulation and prolonged standing. He has tried taking Mobic and ice/rest with some relief. He denies any redness, ecchymosis, or swelling. No fever/chills or signs of systemic illness.   Past Medical History:  Diagnosis Date   Arthralgia    LLE   Kidney stones    Plantar fasciitis    bilateral    Current Outpatient Medications on File Prior to Visit  Medication Sig Dispense Refill   AFLURIA PRESERVATIVE FREE 0.5 ML SUSY Reported on 09/10/2015  0   B Complex Vitamins (VITAMIN B COMPLEX PO) Take by mouth. Super B 100     Cholecalciferol (VITAMIN D3 PO) Take by mouth.     DOCUSATE SODIUM PO Take 1 tablet by mouth 2 (two) times daily.     ferrous sulfate 325 (65 FE) MG EC tablet Take 1 tablet  (325 mg total) by mouth 3 (three) times daily with meals. (Patient not taking: Reported on 03/30/2019) 90 tablet 1   levothyroxine (SYNTHROID, LEVOTHROID) 125 MCG tablet Take 1 po six days a week and 1/2 on sundays     Lidocaine (HM LIDOCAINE PATCH) 4 % PTCH Apply 1 patch topically every 12 (twelve) hours as needed. (Patient not taking: Reported on 03/30/2019) 5 patch 0   meloxicam (MOBIC) 15 MG tablet Take 1 tablet (15 mg total) by mouth daily as needed for pain. 30 tablet 0   No current facility-administered medications on file prior to visit.    Past Surgical History:  Procedure Laterality Date   HERNIA REPAIR     as a child     Allergies  Allergen Reactions   Keflex [Cephalexin]     BP 120/70   Ht '5\' 10"'  (1.778 m)   Wt 165 lb (74.8 kg)   BMI 23.68 kg/m   [facility] Adult Exercise 08/29/2020 03/05/2021  Frequency of aerobic exercise (# of days/week) 6 7  Average time in minutes 60 60  Frequency of strengthening activities (# of days/week) 4 3    No flowsheet data found.      Objective:  Physical Exam:  Gen: Well-appearing, in no acute distress; non-toxic CV:  Regular Rate. Well-perfused. Warm.  Resp: Breathing unlabored on room air; no wheezing. Psych: Fluid speech in conversation; appropriate affect; normal thought process Neuro: Sensation intact throughout. No gross coordination deficits.  MSK:  -Right foot/ankle: + very mild TTP over lateral edge of 5th metatarasal. + TTP over plantar aspect of metatarsals 1-3 digits. No visible erythema, swelling, or ecchymosis. + Bilateral pes planus. Increased eversion/inversion of right foot compared to left. ROM is full in all directions. Mild discomfort with end-range eversion of the foot. No TTP of the navicular, cuboid, achilles tendon insertion. Non-antalgic gait. Negative Talar Tilt, Negative Tinel's at tarsal tunnel.  Gait analysis: - significant pes planus bilaterally - loss of transverse arch bilaterally but  significant on the right with complete collapse with standing. + Splaying of space between 1-2 and 2-3 toes.  - Forceful heel to forefoot strike upon stance phase. Does pronate with stance phase but right foot attempts ambulation on lateral side.      Assessment & Plan:  1. Peroneal tendonitis - acute on chronic 2. Metatarsalgia 3. Pes planus   I believe the patient's pain of the entire foot is biomechanical in nature 2/2 to his pes planus and loss of transverse arch. He is an avid cyclist as well, which could be contributing to the peroneal tendonitis. We discussed the options of custom green sports insoles with modifications to support his arch and offload the 5th metatarsal, but we decided to try metatarsal pads first in all of his shoes and doing home exercises for his peroneal tendons. 2 medium right metatarsal pads were provided to the patient today to place in his shoes. He may ice area and we did discuss using Topical voltaren gel over the 5th met insertion site to help with pain and inflammation. We will see how he does over the next 3-4 weeks and re-evaluate. Next steps would likely be a trial of green sports insoles with scaphoid pad for arch support, 5th ray padding to support this area, +/- metatarsal pad. Also can consider placing carbon-fiber rigid plate underneath insole. If his pain persists specifically over 5th MTP, would be wise to consider repeat U/S vs. X-ray to rule out any possible stress fracture or bony abnormality, opposed to his known tendonitis. Formal PT may be an option as well.   Elba Barman, DO PGY-4, Sports Medicine Fellow [place]  Addendum:  Patient seen in the office by fellow.  His history, exam, plan of care were precepted with me.  Karlton Lemon MD Kirt Boys

## 2021-03-19 ENCOUNTER — Encounter: Payer: Self-pay | Admitting: Internal Medicine

## 2021-03-19 ENCOUNTER — Other Ambulatory Visit: Payer: Self-pay

## 2021-03-19 ENCOUNTER — Ambulatory Visit (AMBULATORY_SURGERY_CENTER): Payer: BC Managed Care – PPO | Admitting: Internal Medicine

## 2021-03-19 VITALS — BP 120/73 | HR 54 | Temp 97.8°F | Resp 12 | Ht 70.0 in | Wt 165.0 lb

## 2021-03-19 DIAGNOSIS — Z1211 Encounter for screening for malignant neoplasm of colon: Secondary | ICD-10-CM | POA: Diagnosis not present

## 2021-03-19 MED ORDER — SODIUM CHLORIDE 0.9 % IV SOLN: 500.0000 mL | Freq: Once | INTRAVENOUS | Status: DC

## 2021-03-19 NOTE — Progress Notes (Signed)
Pt's states no medical or surgical changes since previsit or office visit.   VS taken by [place]

## 2021-03-19 NOTE — Patient Instructions (Signed)
Information on diverticulosis and hemorrhoids given to you today. Resume previous diet and medications.  Repeat colonoscopy in 10 years.  YOU HAD AN ENDOSCOPIC PROCEDURE TODAY AT [facility]:   Refer to the procedure report that was given to you for any specific questions about what was found during the examination.  If the procedure report does not answer your questions, please call your gastroenterologist to clarify.  If you requested that your care partner not be given the details of your procedure findings, then the procedure report has been included in a sealed envelope for you to review at your convenience later.  YOU SHOULD EXPECT: Some feelings of bloating in the abdomen. Passage of more gas than usual.  Walking can help get rid of the air that was put into your GI tract during the procedure and reduce the bloating. If you had a lower endoscopy (such as a colonoscopy or flexible sigmoidoscopy) you may notice spotting of blood in your stool or on the toilet paper. If you underwent a bowel prep for your procedure, you may not have a normal bowel movement for a few days.  Please Note:  You might notice some irritation and congestion in your nose or some drainage.  This is from the oxygen used during your procedure.  There is no need for concern and it should clear up in a day or so.  SYMPTOMS TO REPORT IMMEDIATELY:  Following lower endoscopy (colonoscopy or flexible sigmoidoscopy):  Excessive amounts of blood in the stool  Significant tenderness or worsening of abdominal pains  Swelling of the abdomen that is new, acute  Fever of 100F or higher   For urgent or emergent issues, a gastroenterologist can be reached at any hour by calling [phone]. Do not use MyChart messaging for urgent concerns.    DIET:  We do recommend a small meal at first, but then you may proceed to your regular diet.  Drink plenty of fluids but you should avoid alcoholic beverages for 24  hours.  ACTIVITY:  You should plan to take it easy for the rest of today and you should NOT DRIVE or use heavy machinery until tomorrow (because of the sedation medicines used during the test).    FOLLOW UP: Our staff will call the number listed on your records 48-72 hours following your procedure to check on you and address any questions or concerns that you may have regarding the information given to you following your procedure. If we do not reach you, we will leave a message.  We will attempt to reach you two times.  During this call, we will ask if you have developed any symptoms of COVID 19. If you develop any symptoms (ie: fever, flu-like symptoms, shortness of breath, cough etc.) before then, please call [phone].  If you test positive for Covid 19 in the 2 weeks post procedure, please call and report this information to [place].    If any biopsies were taken you will be contacted by phone or by letter within the next 1-3 weeks.  Please call us at [phone] if you have not heard about the biopsies in 3 weeks.    SIGNATURES/CONFIDENTIALITY: You and/or your care partner have signed paperwork which will be entered into your electronic medical record.  These signatures attest to the fact that that the information above on your After Visit Summary has been reviewed and is understood.  Full responsibility of the confidentiality of this discharge information lies with you and/or  your care-partner.

## 2021-03-19 NOTE — Progress Notes (Signed)
GASTROENTEROLOGY PROCEDURE H&P NOTE   Primary Care Physician: Vernie Shanks, MD    Reason for Procedure:   Colon cancer screening     Plan:    Colonoscopy   Patient is appropriate for endoscopic procedure(s) in the ambulatory ([facility]) setting.  The nature of the procedure, as well as the risks, benefits, and alternatives were carefully and thoroughly reviewed with the patient. Ample time for discussion and questions allowed. The patient understood, was satisfied, and agreed to proceed.     HPI: Benjamin Zimmerman is a 60 y.o. male who presents for colonoscopy.  Med hx as below.  No complaints today including cp, dyspnea, or abd pain.  Tolerated the prep.  Past Medical History:  Diagnosis Date   Anemia    low fertin   Cancer [facility]    Thyroid Cancer Jan 2016   Kidney stones    Plantar fasciitis    bilateral   Thyroid disease    Thyroid Cancer    Past Surgical History:  Procedure Laterality Date   COLONOSCOPY     HERNIA REPAIR     as a child    LITHOTRIPSY      Prior to Admission medications   Medication Sig Start Date End Date Taking? Authorizing Provider  B Complex Vitamins (VITAMIN B COMPLEX PO) Take by mouth. Super B 100   Yes [provider]  Cholecalciferol (VITAMIN D3 PO) Take by mouth.   Yes [provider]  diclofenac Sodium (VOLTAREN) 1 % GEL Apply 4 g topically 4 (four) times daily. 03/05/21  Yes Elba Barman, DO  DOCUSATE SODIUM PO Take 1 tablet by mouth 2 (two) times daily.   Yes [provider]  ferrous sulfate 325 (65 FE) MG EC tablet Take 1 tablet (325 mg total) by mouth 3 (three) times daily with meals. 01/04/18  Sharma Covert, MD  levothyroxine (SYNTHROID, LEVOTHROID) 125 MCG tablet Monday - Saturday daily.  Do not take on Sunday 09/19/15  Yes [provider]  TURMERIC PO Take by mouth.   Yes [provider]  Bartolo Darter COVID-19 AG HOME TEST KIT Use as Directed on the Package 02/09/21   [provider]  cyclobenzaprine (FLEXERIL) 10 MG tablet Take 10 mg by mouth at bedtime as needed. 12/02/20   [provider]    Current Outpatient Medications  Medication Sig Dispense Refill   B Complex Vitamins (VITAMIN B COMPLEX PO) Take by mouth. Super B 100     Cholecalciferol (VITAMIN D3 PO) Take by mouth.     diclofenac Sodium (VOLTAREN) 1 % GEL Apply 4 g topically 4 (four) times daily. 4 g 2   DOCUSATE SODIUM PO Take 1 tablet by mouth 2 (two) times daily.     ferrous sulfate 325 (65 FE) MG EC tablet Take 1 tablet (325 mg total) by mouth 3 (three) times daily with meals. 90 tablet 1   levothyroxine (SYNTHROID, LEVOTHROID) 125 MCG tablet Monday - Saturday daily.  Do not take on Sunday     TURMERIC PO Take by mouth.     BINAXNOW COVID-19 AG HOME TEST KIT Use as Directed on the Package     cyclobenzaprine (FLEXERIL) 10 MG tablet Take 10 mg by mouth at bedtime as needed.     Current Facility-Administered Medications  Medication Dose Route Frequency Provider Last Rate Last Admin   0.9 %  sodium chloride infusion  500 mL Intravenous Once [place] Latulippe, Lajuan Lines, MD  Allergies as of 03/19/2021 - Review Complete 03/19/2021  Allergen Reaction Noted   Neomycin Hives 03/04/2014   Keflex [cephalexin] Rash 05/16/2012    Family History  Problem Relation Age of Onset   Colon polyps Mother    Diabetes Mother    High blood pressure Mother    Obesity Mother    Alzheimer's disease Mother    Breast cancer Mother    Obesity Father    High blood pressure Father    Heart disease Father    Heart attack Father    Neuropathy Neg Hx    Colon cancer Neg Hx    Esophageal cancer Neg Hx    Rectal cancer Neg Hx    Stomach cancer Neg Hx     Social History   Socioeconomic History   Marital status: Married    Spouse name: Not on file   Number of children: 2   Years of education: Not on file   Highest education level: Master's degree (e.g., MA, MS, MEng, MEd, MSW, MBA)  Occupational History   Not on  file  Tobacco Use   Smoking status: Never   Smokeless tobacco: Never  Vaping Use   Vaping Use: Never used  Substance and Sexual Activity   Alcohol use: Yes    Alcohol/week: 6.0 standard drinks    Types: 6 Cans of beer per week    Comment: a few beers a week, usually on weekends   Drug use: Never   Sexual activity: Not on file  Other Topics Concern   Not on file  Social History Narrative   Lives at home with his wife   Right handed   Social Determinants of Health   Financial Resource Strain: Not on file  Food Insecurity: Not on file  Transportation Needs: Not on file  Physical Activity: Not on file  Stress: Not on file  Social Connections: Not on file  Intimate Partner Violence: Not on file    Physical Exam: Vital signs in last 24 hours: _0  128/82   Pulse (!) 58   Temp 97.8 F (36.6 C)   Resp 16   Ht _1  (1.778 m)   Wt 165 lb (74.8 kg)   SpO2 99%   BMI 23.68 kg/m  GEN: NAD EYE: Sclerae anicteric ENT: MMM CV: Non-tachycardic Pulm: CTA b/l GI: Soft, NT/ND NEURO:  Alert & Oriented x 3   Zenovia Jarred, MD [facility]  03/19/2021 8:03 AM

## 2021-03-19 NOTE — Op Note (Signed)
[facility] Patient Name: Benjamin Zimmerman Procedure Date: 03/19/2021 7:59 AM MRN: [id number] Endoscopist: Jerene Bears , MD Age: 60 Referring MD:  Date of Birth: [birth year] Gender: Male Account #: [id number] Procedure:                Colonoscopy Indications:              Screening for colorectal malignant neoplasm Medicines:                Monitored Anesthesia Care Procedure:                Pre-Anesthesia Assessment:                           - Prior to the procedure, a History and Physical                            was performed, and patient medications and                            allergies were reviewed. The patient's tolerance of                            previous anesthesia was also reviewed. The risks                            and benefits of the procedure and the sedation                            options and risks were discussed with the patient.                            All questions were answered, and informed consent                            was obtained. Prior Anticoagulants: The patient has                            taken no previous anticoagulant or antiplatelet                            agents. ASA Grade Assessment: II - A patient with                            mild systemic disease. After reviewing the risks                            and benefits, the patient was deemed in                            satisfactory condition to undergo the procedure.                           After obtaining informed consent, the colonoscope  was passed under direct vision. Throughout the                            procedure, the patient's blood pressure, pulse, and                            oxygen saturations were monitored continuously. The                            Olympus CF-HQ190L ([id number]) Colonoscope was                            introduced through the anus and advanced to the                            cecum, identified by  appendiceal orifice and                            ileocecal valve. The colonoscopy was performed                            without difficulty. The patient tolerated the                            procedure well. The quality of the bowel                            preparation was excellent. The ileocecal valve,                            appendiceal orifice, and rectum were photographed. Scope In: 8:10:26 AM Scope Out: 8:26:47 AM Scope Withdrawal Time: 0 hours 10 minutes 19 seconds  Total Procedure Duration: 0 hours 16 minutes 21 seconds  Findings:                 The digital rectal exam was normal.                           Multiple small-mouthed diverticula were found in                            the sigmoid colon.                           Internal hemorrhoids were found during                            retroflexion. The hemorrhoids were small.                           The exam was otherwise without abnormality. Complications:            No immediate complications. Estimated Blood Loss:     Estimated blood loss: none. Impression:               - Diverticulosis in the sigmoid colon.                           -  Small internal hemorrhoids.                           - The examination was otherwise normal.                           - No specimens collected. Recommendation:           - Patient has a contact number available for                            emergencies. The signs and symptoms of potential                            delayed complications were discussed with the                            patient. Return to normal activities tomorrow.                            Written discharge instructions were provided to the                            patient.                           - Resume previous diet.                           - Continue present medications.                           - Repeat colonoscopy in 10 years for screening                            purposes. Jerene Bears, MD 03/19/2021 8:30:36 AM This report has been signed electronically.

## 2021-03-19 NOTE — Progress Notes (Signed)
Report to PACU, RN, vss, BBS= Clear.  

## 2021-03-21 ENCOUNTER — Telehealth: Payer: Self-pay

## 2021-03-21 NOTE — Telephone Encounter (Signed)
  Follow up Call-  Call back number 03/19/2021  Post procedure Call Back phone  # [phone]  Permission to leave phone message Yes  Some recent data might be hidden     Patient questions:  Do you have a fever, pain , or abdominal swelling? No. Pain Score  0 *  Have you tolerated food without any problems? Yes.    Have you been able to return to your normal activities? Yes.    Do you have any questions about your discharge instructions: Diet   No. Medications  No. Follow up visit  No.  Do you have questions or concerns about your Care? No.  Actions: * If pain score is 4 or above: No action needed, pain <4.

## 2021-03-26 ENCOUNTER — Ambulatory Visit (INDEPENDENT_AMBULATORY_CARE_PROVIDER_SITE_OTHER): Payer: BC Managed Care – PPO | Admitting: Sports Medicine

## 2021-03-26 DIAGNOSIS — M2142 Flat foot [pes planus] (acquired), left foot: Secondary | ICD-10-CM

## 2021-03-26 DIAGNOSIS — M2141 Flat foot [pes planus] (acquired), right foot: Secondary | ICD-10-CM | POA: Diagnosis not present

## 2021-03-26 NOTE — Progress Notes (Signed)
PCP: Vernie Shanks, MD  Subjective:   HPI: Patient is a very pleasant 60 y.o. male here for f/u of (R) lateral foot pain.  He was last seen by me on 03/05/2021 and treated for both metatarsalgia and peroneal tendinitis.  He has done some home exercises for the peroneal tendons, and believes his pain is slightly improved.  In general, his pain is only about a 2/10, although it is somewhat bothersome when he is walking long distances or cycling.  He is a very active individual, cycling frequently.  He does get pain over the lateral aspect of the fifth metatarsal of the right foot.  He also has dealt chronically with metatarsalgia of both feet.  He has had orthotics from other places with a gel insert for cushion unsure if this provided much relief.  He has been using topical Voltaren gel over the fifth MTP, which she does think has been helping to an extent.  He denies any new injuries.  BP 120/74   Ht 5\' 10"  (1.778 m)   Wt 165 lb (74.8 kg)   BMI 23.68 kg/m   [facility] 08/29/2020 03/05/2021 03/26/2021  Frequency of aerobic exercise (# of days/week) 6 7 7   Average time in minutes 60 60 60  Frequency of strengthening activities (# of days/week) 4 3 3     No flowsheet data found.      Objective:  Physical Exam:  Gen: Well-appearing, in no acute distress; non-toxic CV: Regular Rate. Well-perfused. Warm.  Resp: Breathing unlabored on room air; no wheezing. Psych: Fluid speech in conversation; appropriate affect; normal thought process Neuro: Sensation intact throughout. No gross coordination deficits.  MSK:  - Right foot: no significant TTP over 5th MTP today. + Mild TTP over plantar aspect of metatarsals 1-3 b/l. There is a very mild swelling over the lateral aspect of 5th right MTP, otherwise no erythema, swelling, or ecchymosis. Bilateral pes planus.  Range of motion is full in all directions.  Mild discomfort with resisted eversion of the foot. Negative  Tinel's at tarsal tunnel.  Gait analysis: - significant pes planus bilaterally - loss of transverse arch bilaterally but significant on the right with complete collapse with standing. + Splaying of space between 1-2 and 2-3 toes.  - Forceful heel to forefoot strike upon stance phase. Does pronate with stance phase but right foot attempts ambulation on lateral side.   Limited MSK U/S, right foot: - small hypoechoic fluid collection on lateral asepct of 5th metatarsal near peroneal tendon insertion. 5th MTP joint without cortical irregularity. No fibrillar disruption of peroneal tendons at insertion. No hypoechoic fluid or fibrillar disruption of peroneal longus and brevis at level of lateral malleoli.   Assessment & Plan:  1. Pain at tendon insertion of 5th MTP, suspicious for peroneal tendonitis - acute on chronic. 2. Metatarsalgia bilaterally - stable 3. Pes planus, bilaterally  We will treat the patient's biomechanical issues in hopes that this will alleviate the pain of the lateral aspect of the fifth MTP.  He did bring in his custom green sports insoles from home, and I applied the following modifications: -Bilateral scaphoid pads medially - to prevent over-pronation -Lateral heel wedge b/l - to correct him walk on outside of foot with initial strike -5th ray post , right orthotic (blue EVA) - cushion over 5th MTP -Bilateral metatarsal pads, medium - for his metatarsalgia  He will also continue topical Voltaren gel over his painful site.  We did discuss potentially  putting a carbon fiber rich plate underneath the right insole if this does not get better.  This has been an acute on chronic issue for the patient, so if he does not find this helpful, I believe his next step would be obtaining further imaging such as x-ray of the foot to rule out any occult bony pathology.  He will call if he does not find the orthotic modifications comfortable over the next 1-2 weeks, he will also schedule  follow-up appointment in 1 month with myself and Dr. Oneida Alar.  Encouraged to call if any issues arise prior to this.  Total visit time including documentation 35 minutes.  Elba Barman, DO PGY-4, Sports Medicine Fellow [facility]  Addendum:  Patient seen in the office by fellow.  His history, exam, plan of care were precepted with me.  Karlton Lemon MD [facility]

## 2021-04-09 DIAGNOSIS — D649 Anemia, unspecified: Secondary | ICD-10-CM | POA: Diagnosis not present

## 2021-04-09 DIAGNOSIS — R790 Abnormal level of blood mineral: Secondary | ICD-10-CM | POA: Diagnosis not present

## 2021-04-25 DIAGNOSIS — L814 Other melanin hyperpigmentation: Secondary | ICD-10-CM | POA: Diagnosis not present

## 2021-04-25 DIAGNOSIS — D225 Melanocytic nevi of trunk: Secondary | ICD-10-CM | POA: Diagnosis not present

## 2021-04-25 DIAGNOSIS — L308 Other specified dermatitis: Secondary | ICD-10-CM | POA: Diagnosis not present

## 2021-04-25 DIAGNOSIS — L818 Other specified disorders of pigmentation: Secondary | ICD-10-CM | POA: Diagnosis not present

## 2021-04-25 DIAGNOSIS — Z1283 Encounter for screening for malignant neoplasm of skin: Secondary | ICD-10-CM | POA: Diagnosis not present

## 2021-04-29 ENCOUNTER — Ambulatory Visit: Payer: BC Managed Care – PPO | Admitting: Sports Medicine

## 2021-04-29 ENCOUNTER — Ambulatory Visit (INDEPENDENT_AMBULATORY_CARE_PROVIDER_SITE_OTHER): Payer: BC Managed Care – PPO | Admitting: Sports Medicine

## 2021-04-29 DIAGNOSIS — M722 Plantar fascial fibromatosis: Secondary | ICD-10-CM

## 2021-04-29 NOTE — Assessment & Plan Note (Signed)
His left plantar fasciitis seems to be doing well He may have to do some of the heel raises on 1 foot since it hurts his right foot  The right foot shows transverse arch collapse so we added an arch strap I think he has clearly benefited from the sports insoles but I did more buildup along the lateral foot He is going to test these over the next couple months If they help we will continue this strategy otherwise could consider trying to make a special orthotic

## 2021-04-29 NOTE — Progress Notes (Signed)
Chief complaint follow-up of right foot pain  Patient was seen with right foot pain more to the lateral side He is definitely improved using sports insoles with some lateral posting He still is concerned about doing too much walking because the outside of the right foot over the fifth metatarsal does get tender His left foot has had some chronic Planter fasciitis that has improved substantially with heel raises and exercises  He comes today for follow-up and see if I had other thoughts about his treatment  Physical exam, Physically fit white male in no acute distress Ht 5\' 10"  (1.778 m)   Wt 165 lb (74.8 kg)   BMI 23.68 kg/m  Ht 5\' 10"  (1.778 m)   Wt 165 lb (74.8 kg)   BMI 23.68 kg/m   [facility] Adult Exercise 08/29/2020 03/05/2021 03/26/2021  Frequency of aerobic exercise (# of days/week) 6 7 7   Average time in minutes 60 60 60  Frequency of strengthening activities (# of days/week) 4 3 3     Of the right foot is clearly wider than the left foot There is some external rotation of the foot because of subtalar shift There is collapse of the transverse arch On stance there is more pressure along the fifth metatarsal Today the fifth metatarsal is nontender Peroneal tendon testing reveals good strength  The left foot has more of a neutral longitudinal arch There is minimal loss of the transverse arch There is no tenderness at the plantar fascia

## 2021-06-16 DIAGNOSIS — Z6825 Body mass index (BMI) 25.0-25.9, adult: Secondary | ICD-10-CM | POA: Diagnosis not present

## 2021-06-16 DIAGNOSIS — R030 Elevated blood-pressure reading, without diagnosis of hypertension: Secondary | ICD-10-CM | POA: Diagnosis not present

## 2021-06-16 DIAGNOSIS — M545 Low back pain, unspecified: Secondary | ICD-10-CM | POA: Diagnosis not present

## 2021-08-26 DIAGNOSIS — R790 Abnormal level of blood mineral: Secondary | ICD-10-CM | POA: Diagnosis not present

## 2021-09-10 DIAGNOSIS — N401 Enlarged prostate with lower urinary tract symptoms: Secondary | ICD-10-CM | POA: Diagnosis not present

## 2021-09-10 DIAGNOSIS — Z125 Encounter for screening for malignant neoplasm of prostate: Secondary | ICD-10-CM | POA: Diagnosis not present

## 2021-09-10 DIAGNOSIS — N200 Calculus of kidney: Secondary | ICD-10-CM | POA: Diagnosis not present

## 2021-09-10 DIAGNOSIS — N138 Other obstructive and reflux uropathy: Secondary | ICD-10-CM | POA: Diagnosis not present

## 2021-09-10 DIAGNOSIS — R109 Unspecified abdominal pain: Secondary | ICD-10-CM | POA: Diagnosis not present

## 2021-10-14 DIAGNOSIS — Z125 Encounter for screening for malignant neoplasm of prostate: Secondary | ICD-10-CM | POA: Diagnosis not present

## 2021-10-16 DIAGNOSIS — K1120 Sialoadenitis, unspecified: Secondary | ICD-10-CM | POA: Diagnosis not present

## 2021-11-21 DIAGNOSIS — H906 Mixed conductive and sensorineural hearing loss, bilateral: Secondary | ICD-10-CM | POA: Diagnosis not present

## 2022-01-16 DIAGNOSIS — N400 Enlarged prostate without lower urinary tract symptoms: Secondary | ICD-10-CM | POA: Diagnosis not present

## 2022-01-16 DIAGNOSIS — N200 Calculus of kidney: Secondary | ICD-10-CM | POA: Diagnosis not present

## 2022-02-13 DIAGNOSIS — C73 Malignant neoplasm of thyroid gland: Secondary | ICD-10-CM | POA: Diagnosis not present

## 2022-02-13 DIAGNOSIS — E890 Postprocedural hypothyroidism: Secondary | ICD-10-CM | POA: Diagnosis not present

## 2022-02-20 DIAGNOSIS — C73 Malignant neoplasm of thyroid gland: Secondary | ICD-10-CM | POA: Diagnosis not present

## 2022-02-20 DIAGNOSIS — E890 Postprocedural hypothyroidism: Secondary | ICD-10-CM | POA: Diagnosis not present

## 2022-03-03 DIAGNOSIS — Z0000 Encounter for general adult medical examination without abnormal findings: Secondary | ICD-10-CM | POA: Diagnosis not present

## 2022-03-03 DIAGNOSIS — M62838 Other muscle spasm: Secondary | ICD-10-CM | POA: Diagnosis not present

## 2022-03-03 DIAGNOSIS — E7800 Pure hypercholesterolemia, unspecified: Secondary | ICD-10-CM | POA: Diagnosis not present

## 2022-03-03 DIAGNOSIS — E559 Vitamin D deficiency, unspecified: Secondary | ICD-10-CM | POA: Diagnosis not present

## 2022-03-03 DIAGNOSIS — Z862 Personal history of diseases of the blood and blood-forming organs and certain disorders involving the immune mechanism: Secondary | ICD-10-CM | POA: Diagnosis not present

## 2022-03-04 ENCOUNTER — Other Ambulatory Visit: Payer: Self-pay | Admitting: Family Medicine

## 2022-03-04 DIAGNOSIS — E7800 Pure hypercholesterolemia, unspecified: Secondary | ICD-10-CM

## 2022-03-30 ENCOUNTER — Ambulatory Visit
Admission: RE | Admit: 2022-03-30 | Discharge: 2022-03-30 | Disposition: A | Discharge status: Home / Self Care | Payer: No Typology Code available for payment source | Source: Ambulatory Visit | Attending: Family Medicine | Admitting: Family Medicine

## 2022-03-30 DIAGNOSIS — E7800 Pure hypercholesterolemia, unspecified: Secondary | ICD-10-CM

## 2022-03-30 DIAGNOSIS — I2510 Atherosclerotic heart disease of native coronary artery without angina pectoris: Secondary | ICD-10-CM | POA: Diagnosis not present

## 2022-04-20 DIAGNOSIS — N400 Enlarged prostate without lower urinary tract symptoms: Secondary | ICD-10-CM | POA: Diagnosis not present

## 2022-04-20 DIAGNOSIS — N200 Calculus of kidney: Secondary | ICD-10-CM | POA: Diagnosis not present

## 2022-04-20 DIAGNOSIS — R102 Pelvic and perineal pain: Secondary | ICD-10-CM | POA: Diagnosis not present

## 2022-05-08 DIAGNOSIS — E7800 Pure hypercholesterolemia, unspecified: Secondary | ICD-10-CM | POA: Diagnosis not present

## 2022-05-26 DIAGNOSIS — E890 Postprocedural hypothyroidism: Secondary | ICD-10-CM | POA: Diagnosis not present

## 2022-06-11 ENCOUNTER — Ambulatory Visit: Payer: BC Managed Care – PPO | Admitting: Sports Medicine

## 2022-06-18 DIAGNOSIS — M5451 Vertebrogenic low back pain: Secondary | ICD-10-CM | POA: Diagnosis not present

## 2022-06-28 DIAGNOSIS — M5451 Vertebrogenic low back pain: Secondary | ICD-10-CM | POA: Diagnosis not present

## 2022-07-03 DIAGNOSIS — M5451 Vertebrogenic low back pain: Secondary | ICD-10-CM | POA: Diagnosis not present

## 2022-07-17 DIAGNOSIS — H31001 Unspecified chorioretinal scars, right eye: Secondary | ICD-10-CM | POA: Diagnosis not present

## 2022-07-17 DIAGNOSIS — H5213 Myopia, bilateral: Secondary | ICD-10-CM | POA: Diagnosis not present

## 2022-07-17 DIAGNOSIS — M5451 Vertebrogenic low back pain: Secondary | ICD-10-CM | POA: Diagnosis not present

## 2022-07-17 DIAGNOSIS — H02403 Unspecified ptosis of bilateral eyelids: Secondary | ICD-10-CM | POA: Diagnosis not present

## 2022-07-22 DIAGNOSIS — N400 Enlarged prostate without lower urinary tract symptoms: Secondary | ICD-10-CM | POA: Diagnosis not present

## 2022-07-22 DIAGNOSIS — N200 Calculus of kidney: Secondary | ICD-10-CM | POA: Diagnosis not present

## 2022-07-30 DIAGNOSIS — M5451 Vertebrogenic low back pain: Secondary | ICD-10-CM | POA: Diagnosis not present

## 2022-08-14 DIAGNOSIS — M5451 Vertebrogenic low back pain: Secondary | ICD-10-CM | POA: Diagnosis not present

## 2022-08-21 DIAGNOSIS — M5451 Vertebrogenic low back pain: Secondary | ICD-10-CM | POA: Diagnosis not present

## 2022-09-28 DIAGNOSIS — M79672 Pain in left foot: Secondary | ICD-10-CM | POA: Diagnosis not present

## 2022-09-28 DIAGNOSIS — M79662 Pain in left lower leg: Secondary | ICD-10-CM | POA: Diagnosis not present

## 2022-09-30 DIAGNOSIS — M79662 Pain in left lower leg: Secondary | ICD-10-CM | POA: Diagnosis not present

## 2022-09-30 DIAGNOSIS — M79672 Pain in left foot: Secondary | ICD-10-CM | POA: Diagnosis not present

## 2022-10-02 DIAGNOSIS — M79672 Pain in left foot: Secondary | ICD-10-CM | POA: Diagnosis not present

## 2022-10-02 DIAGNOSIS — M79662 Pain in left lower leg: Secondary | ICD-10-CM | POA: Diagnosis not present

## 2022-10-05 DIAGNOSIS — M79662 Pain in left lower leg: Secondary | ICD-10-CM | POA: Diagnosis not present

## 2022-10-05 DIAGNOSIS — M79672 Pain in left foot: Secondary | ICD-10-CM | POA: Diagnosis not present

## 2022-10-26 DIAGNOSIS — M79672 Pain in left foot: Secondary | ICD-10-CM | POA: Diagnosis not present

## 2022-10-26 DIAGNOSIS — M79662 Pain in left lower leg: Secondary | ICD-10-CM | POA: Diagnosis not present

## 2022-10-29 DIAGNOSIS — M79672 Pain in left foot: Secondary | ICD-10-CM | POA: Diagnosis not present

## 2022-10-29 DIAGNOSIS — M79662 Pain in left lower leg: Secondary | ICD-10-CM | POA: Diagnosis not present

## 2022-11-02 DIAGNOSIS — M79672 Pain in left foot: Secondary | ICD-10-CM | POA: Diagnosis not present

## 2022-11-02 DIAGNOSIS — M79662 Pain in left lower leg: Secondary | ICD-10-CM | POA: Diagnosis not present

## 2022-11-04 ENCOUNTER — Other Ambulatory Visit: Payer: Self-pay | Admitting: *Deleted

## 2022-11-04 DIAGNOSIS — M25572 Pain in left ankle and joints of left foot: Secondary | ICD-10-CM

## 2022-11-05 ENCOUNTER — Other Ambulatory Visit: Payer: Self-pay | Admitting: *Deleted

## 2022-11-05 DIAGNOSIS — M79662 Pain in left lower leg: Secondary | ICD-10-CM | POA: Diagnosis not present

## 2022-11-05 DIAGNOSIS — M79672 Pain in left foot: Secondary | ICD-10-CM | POA: Diagnosis not present

## 2022-11-16 ENCOUNTER — Ambulatory Visit (INDEPENDENT_AMBULATORY_CARE_PROVIDER_SITE_OTHER): Payer: BC Managed Care – PPO | Admitting: Family Medicine

## 2022-11-16 ENCOUNTER — Encounter: Payer: Self-pay | Admitting: Family Medicine

## 2022-11-16 ENCOUNTER — Ambulatory Visit
Admission: RE | Admit: 2022-11-16 | Discharge: 2022-11-16 | Disposition: A | Discharge status: Home / Self Care | Payer: BC Managed Care – PPO | Source: Ambulatory Visit | Attending: Family Medicine | Admitting: Family Medicine

## 2022-11-16 VITALS — BP 122/82 | Ht 70.0 in | Wt 165.0 lb

## 2022-11-16 DIAGNOSIS — M722 Plantar fascial fibromatosis: Secondary | ICD-10-CM | POA: Diagnosis not present

## 2022-11-16 DIAGNOSIS — M2141 Flat foot [pes planus] (acquired), right foot: Secondary | ICD-10-CM | POA: Diagnosis not present

## 2022-11-16 DIAGNOSIS — M25572 Pain in left ankle and joints of left foot: Secondary | ICD-10-CM

## 2022-11-16 DIAGNOSIS — M2142 Flat foot [pes planus] (acquired), left foot: Secondary | ICD-10-CM | POA: Diagnosis not present

## 2022-11-16 NOTE — Patient Instructions (Signed)
You have plantar fasciitis and strain of your extensor digitorum. Topical voltaren gel up to 4 times a day. Plantar fascia stretch for 20-30 seconds (do 3 of these) in morning Do home exercises as directed at least daily (up to twice a day). Ice heel for 15 minutes as needed. Avoid flat shoes/barefoot walking as much as possible. Arch straps have been shown to help with pain. Try those inserts again from Dr. Karie Schwalbe. Physical therapy is also an option as well as a walking boot if the extensor digitorum strain is bothering you enough. Follow up with me in 6 weeks.

## 2022-11-16 NOTE — Progress Notes (Signed)
PCP: Ileana Ladd, MD (Inactive)  Subjective:   HPI: Patient is a 62 y.o. male here for left foot pain.  Cramping of left second and third toes and pain of the left heel. Cramping worse with activity. Often feels his toes are popping. Cracks his toes with mild temporary improvement. Pain of the heel is worse with standing in the morning and progresses throughout the day. Has been trying dry needling with limited benefit. Has custom orthotics that do not help and has not been wearing. Sometimes wears over the counter orthotics. Feels narrower shoes are more comfortable Recent left foot xray without acute fracture or dislocation. Plantar spur on calcaneus, 1 mm calcification at the third metatarsophaleangeal joint suggestive of prior injury. Cycles about 15 miles every morning.  Past Medical History:  Diagnosis Date   Anemia    low fertin   Cancer [facility]    Thyroid Cancer Jan 2016   Kidney stones    Plantar fasciitis    bilateral   Thyroid disease    Thyroid Cancer    Current Outpatient Medications on File Prior to Visit  Medication Sig Dispense Refill   B Complex Vitamins (VITAMIN B COMPLEX PO) Take by mouth. Super B 100     BINAXNOW COVID-19 AG HOME TEST KIT Use as Directed on the Package     Cholecalciferol (VITAMIN D3 PO) Take by mouth.     cyclobenzaprine (FLEXERIL) 10 MG tablet Take 10 mg by mouth at bedtime as needed.     diclofenac Sodium (VOLTAREN) 1 % GEL Apply 4 g topically 4 (four) times daily. 4 g 2   DOCUSATE SODIUM PO Take 1 tablet by mouth 2 (two) times daily.     ferrous sulfate 325 (65 FE) MG EC tablet Take 1 tablet (325 mg total) by mouth 3 (three) times daily with meals. 90 tablet 1   levothyroxine (SYNTHROID, LEVOTHROID) 125 MCG tablet Monday - Saturday daily.  Do not take on Sunday     TURMERIC PO Take by mouth.     No current facility-administered medications on file prior to visit.    Past Surgical History:  Procedure Laterality Date   COLONOSCOPY      HERNIA REPAIR     as a child    LITHOTRIPSY      Allergies  Allergen Reactions   Neomycin Hives   Keflex [Cephalexin] Rash    There were no vitals taken for this visit.     08/29/2020    9:04 AM 03/05/2021   10:42 AM 03/26/2021   10:29 AM  [facility]  Frequency of aerobic exercise (# of days/week) 6 7 7   Average time in minutes 60 60 60  Frequency of strengthening activities (# of days/week) 4 3 3         No data to display              Objective:  Physical Exam:  Gen: NAD, comfortable in exam room Hips Decreased external rotation of the R hip compared to L  Left foot Bilateral pes planus TTP with dorsiflexion of the left foot of the plantar heel, no achilles tenderness Negative talar tilt and anterior drawer Tenderness with palpation anterior tibialis with prominent anterior tibialis tendon and extensor digitorum  Normal ROM of the left foot with normal strength Negative calcaneal squeeze. NV intact distally   Assessment & Plan:  1. Plantar fascialis- Home exercises and plantar fascia stretches.Icing heel as needed. Arch straps and orthotics and  wearing comfortable shoes.  2.Toe pain- Likely extensor digitorum strain. Voltaren gel for pain. Discussed activity modification to reduce ankle motion. Consider walking boot and physical therapy if not improving.

## 2022-11-19 DIAGNOSIS — M9904 Segmental and somatic dysfunction of sacral region: Secondary | ICD-10-CM | POA: Diagnosis not present

## 2022-11-19 DIAGNOSIS — M62838 Other muscle spasm: Secondary | ICD-10-CM | POA: Diagnosis not present

## 2022-11-19 DIAGNOSIS — M9905 Segmental and somatic dysfunction of pelvic region: Secondary | ICD-10-CM | POA: Diagnosis not present

## 2022-11-19 DIAGNOSIS — M9903 Segmental and somatic dysfunction of lumbar region: Secondary | ICD-10-CM | POA: Diagnosis not present

## 2022-11-24 DIAGNOSIS — M62838 Other muscle spasm: Secondary | ICD-10-CM | POA: Diagnosis not present

## 2022-11-24 DIAGNOSIS — M9904 Segmental and somatic dysfunction of sacral region: Secondary | ICD-10-CM | POA: Diagnosis not present

## 2022-11-24 DIAGNOSIS — M9905 Segmental and somatic dysfunction of pelvic region: Secondary | ICD-10-CM | POA: Diagnosis not present

## 2022-11-24 DIAGNOSIS — M9903 Segmental and somatic dysfunction of lumbar region: Secondary | ICD-10-CM | POA: Diagnosis not present

## 2022-12-03 DIAGNOSIS — M62838 Other muscle spasm: Secondary | ICD-10-CM | POA: Diagnosis not present

## 2022-12-03 DIAGNOSIS — M9904 Segmental and somatic dysfunction of sacral region: Secondary | ICD-10-CM | POA: Diagnosis not present

## 2022-12-03 DIAGNOSIS — M9903 Segmental and somatic dysfunction of lumbar region: Secondary | ICD-10-CM | POA: Diagnosis not present

## 2022-12-03 DIAGNOSIS — M9905 Segmental and somatic dysfunction of pelvic region: Secondary | ICD-10-CM | POA: Diagnosis not present

## 2022-12-04 DIAGNOSIS — D225 Melanocytic nevi of trunk: Secondary | ICD-10-CM | POA: Diagnosis not present

## 2022-12-04 DIAGNOSIS — L820 Inflamed seborrheic keratosis: Secondary | ICD-10-CM | POA: Diagnosis not present

## 2022-12-04 DIAGNOSIS — D485 Neoplasm of uncertain behavior of skin: Secondary | ICD-10-CM | POA: Diagnosis not present

## 2022-12-04 DIAGNOSIS — D2239 Melanocytic nevi of other parts of face: Secondary | ICD-10-CM | POA: Diagnosis not present

## 2022-12-04 DIAGNOSIS — Z1283 Encounter for screening for malignant neoplasm of skin: Secondary | ICD-10-CM | POA: Diagnosis not present

## 2022-12-17 DIAGNOSIS — M62838 Other muscle spasm: Secondary | ICD-10-CM | POA: Diagnosis not present

## 2022-12-17 DIAGNOSIS — M9904 Segmental and somatic dysfunction of sacral region: Secondary | ICD-10-CM | POA: Diagnosis not present

## 2022-12-17 DIAGNOSIS — M9903 Segmental and somatic dysfunction of lumbar region: Secondary | ICD-10-CM | POA: Diagnosis not present

## 2022-12-17 DIAGNOSIS — M9905 Segmental and somatic dysfunction of pelvic region: Secondary | ICD-10-CM | POA: Diagnosis not present

## 2022-12-24 DIAGNOSIS — N400 Enlarged prostate without lower urinary tract symptoms: Secondary | ICD-10-CM | POA: Diagnosis not present

## 2023-01-07 DIAGNOSIS — M9905 Segmental and somatic dysfunction of pelvic region: Secondary | ICD-10-CM | POA: Diagnosis not present

## 2023-01-07 DIAGNOSIS — M9903 Segmental and somatic dysfunction of lumbar region: Secondary | ICD-10-CM | POA: Diagnosis not present

## 2023-01-07 DIAGNOSIS — M62838 Other muscle spasm: Secondary | ICD-10-CM | POA: Diagnosis not present

## 2023-01-07 DIAGNOSIS — M9904 Segmental and somatic dysfunction of sacral region: Secondary | ICD-10-CM | POA: Diagnosis not present

## 2023-01-08 ENCOUNTER — Encounter: Payer: Self-pay | Admitting: Family Medicine

## 2023-01-12 DIAGNOSIS — E890 Postprocedural hypothyroidism: Secondary | ICD-10-CM | POA: Diagnosis not present

## 2023-01-18 ENCOUNTER — Encounter (INDEPENDENT_AMBULATORY_CARE_PROVIDER_SITE_OTHER): Payer: Self-pay

## 2023-01-24 HISTORY — PX: CYSTOSCOPY WITH INSERTION OF UROLIFT: SHX6678

## 2023-01-26 DIAGNOSIS — N400 Enlarged prostate without lower urinary tract symptoms: Secondary | ICD-10-CM | POA: Diagnosis not present

## 2023-01-26 DIAGNOSIS — N200 Calculus of kidney: Secondary | ICD-10-CM | POA: Diagnosis not present

## 2023-01-29 DIAGNOSIS — M722 Plantar fascial fibromatosis: Secondary | ICD-10-CM | POA: Diagnosis not present

## 2023-01-29 DIAGNOSIS — R252 Cramp and spasm: Secondary | ICD-10-CM | POA: Diagnosis not present

## 2023-02-09 DIAGNOSIS — N200 Calculus of kidney: Secondary | ICD-10-CM | POA: Diagnosis not present

## 2023-02-09 DIAGNOSIS — E785 Hyperlipidemia, unspecified: Secondary | ICD-10-CM | POA: Diagnosis not present

## 2023-02-09 DIAGNOSIS — Z79899 Other long term (current) drug therapy: Secondary | ICD-10-CM | POA: Diagnosis not present

## 2023-02-09 DIAGNOSIS — Z79890 Hormone replacement therapy (postmenopausal): Secondary | ICD-10-CM | POA: Diagnosis not present

## 2023-02-09 DIAGNOSIS — E890 Postprocedural hypothyroidism: Secondary | ICD-10-CM | POA: Diagnosis not present

## 2023-02-09 DIAGNOSIS — Z87442 Personal history of urinary calculi: Secondary | ICD-10-CM | POA: Diagnosis not present

## 2023-02-09 DIAGNOSIS — Z01812 Encounter for preprocedural laboratory examination: Secondary | ICD-10-CM | POA: Diagnosis not present

## 2023-02-09 DIAGNOSIS — N400 Enlarged prostate without lower urinary tract symptoms: Secondary | ICD-10-CM | POA: Diagnosis not present

## 2023-02-22 DIAGNOSIS — Z79899 Other long term (current) drug therapy: Secondary | ICD-10-CM | POA: Diagnosis not present

## 2023-02-22 DIAGNOSIS — N400 Enlarged prostate without lower urinary tract symptoms: Secondary | ICD-10-CM | POA: Diagnosis not present

## 2023-02-22 DIAGNOSIS — N138 Other obstructive and reflux uropathy: Secondary | ICD-10-CM | POA: Diagnosis not present

## 2023-02-22 DIAGNOSIS — N401 Enlarged prostate with lower urinary tract symptoms: Secondary | ICD-10-CM | POA: Diagnosis not present

## 2023-02-22 DIAGNOSIS — R350 Frequency of micturition: Secondary | ICD-10-CM | POA: Diagnosis not present

## 2023-02-22 DIAGNOSIS — E890 Postprocedural hypothyroidism: Secondary | ICD-10-CM | POA: Diagnosis not present

## 2023-02-22 DIAGNOSIS — E785 Hyperlipidemia, unspecified: Secondary | ICD-10-CM | POA: Diagnosis not present

## 2023-02-22 DIAGNOSIS — Z79890 Hormone replacement therapy (postmenopausal): Secondary | ICD-10-CM | POA: Diagnosis not present

## 2023-02-22 DIAGNOSIS — Z881 Allergy status to other antibiotic agents status: Secondary | ICD-10-CM | POA: Diagnosis not present

## 2023-02-24 DIAGNOSIS — Z23 Encounter for immunization: Secondary | ICD-10-CM | POA: Diagnosis not present

## 2023-03-08 DIAGNOSIS — M9904 Segmental and somatic dysfunction of sacral region: Secondary | ICD-10-CM | POA: Diagnosis not present

## 2023-03-08 DIAGNOSIS — M62838 Other muscle spasm: Secondary | ICD-10-CM | POA: Diagnosis not present

## 2023-03-08 DIAGNOSIS — M9905 Segmental and somatic dysfunction of pelvic region: Secondary | ICD-10-CM | POA: Diagnosis not present

## 2023-03-08 DIAGNOSIS — M9903 Segmental and somatic dysfunction of lumbar region: Secondary | ICD-10-CM | POA: Diagnosis not present

## 2023-03-12 DIAGNOSIS — M722 Plantar fascial fibromatosis: Secondary | ICD-10-CM | POA: Diagnosis not present

## 2023-03-17 DIAGNOSIS — N200 Calculus of kidney: Secondary | ICD-10-CM | POA: Diagnosis not present

## 2023-03-29 DIAGNOSIS — M25572 Pain in left ankle and joints of left foot: Secondary | ICD-10-CM | POA: Diagnosis not present

## 2023-03-31 DIAGNOSIS — E890 Postprocedural hypothyroidism: Secondary | ICD-10-CM | POA: Diagnosis not present

## 2023-04-16 DIAGNOSIS — M722 Plantar fascial fibromatosis: Secondary | ICD-10-CM | POA: Diagnosis not present

## 2023-04-20 DIAGNOSIS — E7800 Pure hypercholesterolemia, unspecified: Secondary | ICD-10-CM | POA: Diagnosis not present

## 2023-04-20 DIAGNOSIS — Z125 Encounter for screening for malignant neoplasm of prostate: Secondary | ICD-10-CM | POA: Diagnosis not present

## 2023-04-20 DIAGNOSIS — Z0000 Encounter for general adult medical examination without abnormal findings: Secondary | ICD-10-CM | POA: Diagnosis not present

## 2023-04-20 DIAGNOSIS — J019 Acute sinusitis, unspecified: Secondary | ICD-10-CM | POA: Diagnosis not present

## 2023-04-20 DIAGNOSIS — Z862 Personal history of diseases of the blood and blood-forming organs and certain disorders involving the immune mechanism: Secondary | ICD-10-CM | POA: Diagnosis not present

## 2023-04-20 DIAGNOSIS — E559 Vitamin D deficiency, unspecified: Secondary | ICD-10-CM | POA: Diagnosis not present

## 2023-05-20 DIAGNOSIS — N400 Enlarged prostate without lower urinary tract symptoms: Secondary | ICD-10-CM | POA: Diagnosis not present

## 2023-05-20 DIAGNOSIS — N200 Calculus of kidney: Secondary | ICD-10-CM | POA: Diagnosis not present

## 2023-05-25 DIAGNOSIS — E7800 Pure hypercholesterolemia, unspecified: Secondary | ICD-10-CM | POA: Diagnosis not present

## 2023-06-24 DIAGNOSIS — E890 Postprocedural hypothyroidism: Secondary | ICD-10-CM | POA: Diagnosis not present

## 2023-07-13 DIAGNOSIS — H903 Sensorineural hearing loss, bilateral: Secondary | ICD-10-CM | POA: Diagnosis not present

## 2023-07-20 DIAGNOSIS — H31003 Unspecified chorioretinal scars, bilateral: Secondary | ICD-10-CM | POA: Diagnosis not present

## 2023-07-20 DIAGNOSIS — H5213 Myopia, bilateral: Secondary | ICD-10-CM | POA: Diagnosis not present

## 2023-08-04 DIAGNOSIS — J069 Acute upper respiratory infection, unspecified: Secondary | ICD-10-CM | POA: Diagnosis not present

## 2023-08-04 DIAGNOSIS — B9689 Other specified bacterial agents as the cause of diseases classified elsewhere: Secondary | ICD-10-CM | POA: Diagnosis not present

## 2023-09-08 ENCOUNTER — Encounter: Payer: Self-pay | Admitting: Sports Medicine

## 2023-09-08 ENCOUNTER — Ambulatory Visit (INDEPENDENT_AMBULATORY_CARE_PROVIDER_SITE_OTHER): Admitting: Sports Medicine

## 2023-09-08 VITALS — BP 120/72 | Ht 70.0 in | Wt 165.0 lb

## 2023-09-08 DIAGNOSIS — S43001A Unspecified subluxation of right shoulder joint, initial encounter: Secondary | ICD-10-CM

## 2023-09-08 NOTE — Progress Notes (Signed)
   Subjective:    Patient ID: Benjamin Zimmerman, male    DOB: 1960/09/18, 63 y.o.   MRN: [id number]  HPI chief complaint: Right shoulder pain  Benjamin Zimmerman is a very pleasant right-hand-dominant 63 year old male that presents today with right shoulder pain.  He has a longstanding history of right shoulder subluxations.  He has never had surgery for these.  He has underlying hypermobility as well.  He did seek treatment about 10 years ago with Dr. Alfredo Ano who recommended nonsurgical treatment.  He has tried some limited home exercises in the past.  He describes a feeling of the shoulder shifting as he places his arm in an abducted and externally rotated position.  He also has pain with this.  He denies any complete dislocations of the shoulder in the past.  Pain will radiate into the biceps at times.  No recent trauma.  Past medical history reviewed Medications reviewed Allergies reviewed   Review of Systems As above    Objective:   Physical Exam  Well-developed, well-nourished.  No acute distress  Right shoulder: Good range of motion.  No tenderness to palpation.  Positive sulcus and positive apprehension.  Rotator cuff strength is 5/5 and does not reproduce pain.  No signs of impingement.  Minimal pain with O'Brien's testing.      Assessment & Plan:   Chronic right shoulder subluxations  I recommended physical therapy at [facility] with a subsequent follow-up appointment with me in about 6 weeks.  We did discuss workup in the form of x-ray and MRI but we will hold on that for now as we do not want to get too aggressive with treatment if possible.  This note was dictated using Dragon naturally speaking software and may contain errors in syntax, spelling, or content which have not been identified prior to signing this note.

## 2023-09-09 DIAGNOSIS — M25611 Stiffness of right shoulder, not elsewhere classified: Secondary | ICD-10-CM | POA: Diagnosis not present

## 2023-09-09 DIAGNOSIS — M6281 Muscle weakness (generalized): Secondary | ICD-10-CM | POA: Diagnosis not present

## 2023-09-09 DIAGNOSIS — S43081D Other subluxation of right shoulder joint, subsequent encounter: Secondary | ICD-10-CM | POA: Diagnosis not present

## 2023-09-09 DIAGNOSIS — M25511 Pain in right shoulder: Secondary | ICD-10-CM | POA: Diagnosis not present

## 2023-09-14 DIAGNOSIS — M6281 Muscle weakness (generalized): Secondary | ICD-10-CM | POA: Diagnosis not present

## 2023-09-14 DIAGNOSIS — M25511 Pain in right shoulder: Secondary | ICD-10-CM | POA: Diagnosis not present

## 2023-09-14 DIAGNOSIS — M25611 Stiffness of right shoulder, not elsewhere classified: Secondary | ICD-10-CM | POA: Diagnosis not present

## 2023-09-14 DIAGNOSIS — S43081D Other subluxation of right shoulder joint, subsequent encounter: Secondary | ICD-10-CM | POA: Diagnosis not present

## 2023-09-16 DIAGNOSIS — M25611 Stiffness of right shoulder, not elsewhere classified: Secondary | ICD-10-CM | POA: Diagnosis not present

## 2023-09-16 DIAGNOSIS — S43081D Other subluxation of right shoulder joint, subsequent encounter: Secondary | ICD-10-CM | POA: Diagnosis not present

## 2023-09-16 DIAGNOSIS — M25511 Pain in right shoulder: Secondary | ICD-10-CM | POA: Diagnosis not present

## 2023-09-16 DIAGNOSIS — M6281 Muscle weakness (generalized): Secondary | ICD-10-CM | POA: Diagnosis not present

## 2023-09-22 DIAGNOSIS — X32XXXA Exposure to sunlight, initial encounter: Secondary | ICD-10-CM | POA: Diagnosis not present

## 2023-09-22 DIAGNOSIS — L570 Actinic keratosis: Secondary | ICD-10-CM | POA: Diagnosis not present

## 2023-09-22 DIAGNOSIS — B078 Other viral warts: Secondary | ICD-10-CM | POA: Diagnosis not present

## 2023-09-22 DIAGNOSIS — D225 Melanocytic nevi of trunk: Secondary | ICD-10-CM | POA: Diagnosis not present

## 2023-09-22 DIAGNOSIS — Z1283 Encounter for screening for malignant neoplasm of skin: Secondary | ICD-10-CM | POA: Diagnosis not present

## 2023-09-23 DIAGNOSIS — M6281 Muscle weakness (generalized): Secondary | ICD-10-CM | POA: Diagnosis not present

## 2023-09-23 DIAGNOSIS — M25611 Stiffness of right shoulder, not elsewhere classified: Secondary | ICD-10-CM | POA: Diagnosis not present

## 2023-09-23 DIAGNOSIS — M25511 Pain in right shoulder: Secondary | ICD-10-CM | POA: Diagnosis not present

## 2023-09-23 DIAGNOSIS — S43081D Other subluxation of right shoulder joint, subsequent encounter: Secondary | ICD-10-CM | POA: Diagnosis not present

## 2023-09-28 DIAGNOSIS — E890 Postprocedural hypothyroidism: Secondary | ICD-10-CM | POA: Diagnosis not present

## 2023-09-28 DIAGNOSIS — M6281 Muscle weakness (generalized): Secondary | ICD-10-CM | POA: Diagnosis not present

## 2023-09-28 DIAGNOSIS — M25611 Stiffness of right shoulder, not elsewhere classified: Secondary | ICD-10-CM | POA: Diagnosis not present

## 2023-09-28 DIAGNOSIS — S43081D Other subluxation of right shoulder joint, subsequent encounter: Secondary | ICD-10-CM | POA: Diagnosis not present

## 2023-09-28 DIAGNOSIS — E7800 Pure hypercholesterolemia, unspecified: Secondary | ICD-10-CM | POA: Diagnosis not present

## 2023-09-28 DIAGNOSIS — D649 Anemia, unspecified: Secondary | ICD-10-CM | POA: Diagnosis not present

## 2023-09-28 DIAGNOSIS — M25511 Pain in right shoulder: Secondary | ICD-10-CM | POA: Diagnosis not present

## 2023-10-08 DIAGNOSIS — M25511 Pain in right shoulder: Secondary | ICD-10-CM | POA: Diagnosis not present

## 2023-10-08 DIAGNOSIS — M25611 Stiffness of right shoulder, not elsewhere classified: Secondary | ICD-10-CM | POA: Diagnosis not present

## 2023-10-08 DIAGNOSIS — M6281 Muscle weakness (generalized): Secondary | ICD-10-CM | POA: Diagnosis not present

## 2023-10-08 DIAGNOSIS — S43081D Other subluxation of right shoulder joint, subsequent encounter: Secondary | ICD-10-CM | POA: Diagnosis not present

## 2023-10-14 DIAGNOSIS — M25511 Pain in right shoulder: Secondary | ICD-10-CM | POA: Diagnosis not present

## 2023-10-14 DIAGNOSIS — M6281 Muscle weakness (generalized): Secondary | ICD-10-CM | POA: Diagnosis not present

## 2023-10-14 DIAGNOSIS — M25611 Stiffness of right shoulder, not elsewhere classified: Secondary | ICD-10-CM | POA: Diagnosis not present

## 2023-10-14 DIAGNOSIS — S43081D Other subluxation of right shoulder joint, subsequent encounter: Secondary | ICD-10-CM | POA: Diagnosis not present

## 2023-10-20 ENCOUNTER — Ambulatory Visit: Admitting: Sports Medicine

## 2023-11-18 DIAGNOSIS — Z9889 Other specified postprocedural states: Secondary | ICD-10-CM | POA: Diagnosis not present

## 2023-11-18 DIAGNOSIS — N401 Enlarged prostate with lower urinary tract symptoms: Secondary | ICD-10-CM | POA: Diagnosis not present

## 2023-11-18 DIAGNOSIS — N200 Calculus of kidney: Secondary | ICD-10-CM | POA: Diagnosis not present

## 2023-11-18 DIAGNOSIS — Z1330 Encounter for screening examination for mental health and behavioral disorders, unspecified: Secondary | ICD-10-CM | POA: Diagnosis not present

## 2023-11-18 DIAGNOSIS — N138 Other obstructive and reflux uropathy: Secondary | ICD-10-CM | POA: Diagnosis not present

## 2023-11-18 DIAGNOSIS — Z125 Encounter for screening for malignant neoplasm of prostate: Secondary | ICD-10-CM | POA: Diagnosis not present

## 2023-11-23 DIAGNOSIS — R1013 Epigastric pain: Secondary | ICD-10-CM | POA: Diagnosis not present

## 2023-11-23 DIAGNOSIS — R197 Diarrhea, unspecified: Secondary | ICD-10-CM | POA: Diagnosis not present

## 2023-12-03 DIAGNOSIS — R197 Diarrhea, unspecified: Secondary | ICD-10-CM | POA: Diagnosis not present

## 2023-12-15 DIAGNOSIS — E890 Postprocedural hypothyroidism: Secondary | ICD-10-CM | POA: Diagnosis not present

## 2024-01-28 ENCOUNTER — Telehealth: Payer: Self-pay | Admitting: Internal Medicine

## 2024-01-28 NOTE — Telephone Encounter (Signed)
 Received  a call from patient regarding 9/8 Benjamin Zimmerman RS. Patient states he has been having chronic diarrhea for 3 months ongoing, he is scheduled for 11/5 but is requesting nurse fu to discuss any other solutions. Please review and advise.  Thank you

## 2024-01-28 NOTE — Telephone Encounter (Signed)
 Left message for pt to call back.  Spoke with pt and scheduled him to see Alan Coombs PA 02/07/24 at 8:40am Pt aware of appt.

## 2024-01-31 ENCOUNTER — Ambulatory Visit: Admitting: Physician Assistant

## 2024-02-01 DIAGNOSIS — Z23 Encounter for immunization: Secondary | ICD-10-CM | POA: Diagnosis not present

## 2024-02-04 DIAGNOSIS — E890 Postprocedural hypothyroidism: Secondary | ICD-10-CM | POA: Diagnosis not present

## 2024-02-04 DIAGNOSIS — C73 Malignant neoplasm of thyroid gland: Secondary | ICD-10-CM | POA: Diagnosis not present

## 2024-02-04 DIAGNOSIS — Z9089 Acquired absence of other organs: Secondary | ICD-10-CM | POA: Diagnosis not present

## 2024-02-04 NOTE — Progress Notes (Signed)
 02/07/2024 Benjamin Zimmerman [id number] January 30, 1961  Referring provider: No ref. provider found Primary GI doctor: Dr. Albertus  ASSESSMENT AND PLAN:  Diarrhea since June, mom with history of UC 2-3 a day, rare nocturnal symptoms, had urgency but no fecal incontinence Denies hematochezia, melena, weight loss, AB pain, AB bloating 12/03/2023 negative Hemoccult cards,GI pathogen panel including C. difficile, ferritin 78 Hgb 14.1 unremarkable liver function.   03/19/2021 colonoscopy recall 10 years, no biopsies Has added fiber and probiotic with some help Chronic watery diarrhea with negative hemocult and GI pathogen panel. Differential includes microscopic colitis, celiac disease, and malabsorption. Microscopic colitis suspected due to potential NSAID use. Family history of ulcerative colitis noted. - Order colonoscopy for microscopic colitis evaluation. - Order EGD for malabsorption or celiac disease assessment. - Check labs for celiac disease and inflammatory markers. - Provide dietary information on FODMAP diet and lactose intolerance. - Advise continuation of fiber supplementation. - can take imodium as needed - if negative consider empiric trial of xifaxin  IDA since 40's, did trial off iron and he had drop in ferritin and had to get back on it.  ferritin 04/21/2023 12.9 Ferritin 78 on 12/03/2023, better on 1 iron a day States has had anemia since his 2's, has never had EGD, had negative FOBT 12/03/2023  Long-standing iron deficiency anemia with low ferritin, responsive to iron supplementation. No identified source of iron loss. Previous colonoscopies unremarkable. - Order EGD to evaluate for upper GI sources of iron loss. - Check labs for celiac disease and inflammatory markers.  Screening colonoscopy 03/19/2021 colonoscopy Dr. Albertus excellent prep diverticula sigmoid colon, internal hemorrhoids otherwise unremarkable recall 10 years  GERD Has meloxicam  and turmeric on  medication list but takes more as needed Is not on PPI, denies GERD  History of hypothyroidism On levothyroxine  Patient Care Team: Cyrena Gwenn SQUIBB, MD (Inactive) as PCP - General (Family Medicine) Curtis Debby PARAS, MD as Consulting Physician (Sports Medicine)  HISTORY OF PRESENT ILLNESS: 63 y.o. male with a past medical history listed below presents for evaluation of diarrhea.   Discussed the use of AI scribe software for clinical note transcription with the patient, who gave verbal consent to proceed.  History of Present Illness   Benjamin Zimmerman is a 63 year old male who presents with chronic diarrhea.  He has experienced chronic diarrhea since early June 2025, with stools initially being 'explosive' and very loose. The addition of fiber and probiotics has led to some improvement, but he has not returned to his previous regularity. He now has bowel movements daily, sometimes two to three times a day, compared to his usual once daily. He occasionally experiences increased urgency and has had rare nighttime symptoms, waking up a couple of times in the past three months. No fecal incontinence, dark or bloody stools, weight loss, pain, discomfort, or bloating. Watery stools were more frequent in June, July, and part of August.  He has a long history of low ferritin, first noted in his forties, which led to his first colonoscopy. He has been on iron supplements intermittently, currently taking one tablet daily after previously taking two. He stopped the iron for a month on his primary care doctor's advice, but resumed after noticing no improvement. His ferritin levels drop significantly when off iron. He has had multiple colonoscopies, the most recent in 2022.  He denies any new medications or supplements in the past six months, aside from the addition of fiber and probiotics. He occasionally  takes meloxicam , about one or two times a month, and uses turmeric for its anti-inflammatory  properties. He does not take any medications for reflux, having discontinued Prilosec after a brief trial due to concerns about its interaction with Synthroid.  He maintains a consistent diet and has not introduced any new foods or drinks. He exercises almost daily and reports no changes in his energy levels or daily activities. No smoking.  His mother had ulcerative colitis, but he has never experienced symptoms suggestive of this condition. No upper GI symptoms, trouble swallowing, nausea, vomiting, heartburn, shortness of breath, chest pain, rectal pain, burning, itching, or discomfort. No correlation of symptoms with food intake.      He  reports that he has never smoked. He has never used smokeless tobacco. He reports current alcohol use of about 6.0 standard drinks of alcohol per week. He reports that he does not use drugs.  RELEVANT GI HISTORY, IMAGING AND LABS: Results   LABS Hemoccult: Negative (12/03/2023) GI Pathogen Panel: Negative (12/03/2023) Ferritin: Low (03/2023)      CBC    Component Value Date/Time   WBC 5.4 02/07/2024 0937   RBC 5.14 02/07/2024 0937   HGB 14.9 02/07/2024 0937   HGB 14.8 06/23/2018 1434   HCT 44.8 02/07/2024 0937   HCT 43.8 06/23/2018 1434   PLT 320.0 02/07/2024 0937   PLT 282 06/23/2018 1434   MCV 87.2 02/07/2024 0937   MCV 88 06/23/2018 1434   MCH 29.8 06/23/2018 1434   MCH 28.9 09/27/2015 1024   MCHC 33.2 02/07/2024 0937   RDW 13.1 02/07/2024 0937   RDW 11.9 06/23/2018 1434   LYMPHSABS 1.4 02/07/2024 0937   MONOABS 0.5 02/07/2024 0937   EOSABS 0.1 02/07/2024 0937   BASOSABS 0.0 02/07/2024 0937   Recent Labs    02/07/24 0937  HGB 14.9    CMP     Component Value Date/Time   NA 139 02/07/2024 0937   K 4.0 02/07/2024 0937   CL 103 02/07/2024 0937   CO2 27 02/07/2024 0937   GLUCOSE 95 02/07/2024 0937   BUN 13 02/07/2024 0937   CREATININE 0.95 02/07/2024 0937   CREATININE 0.91 09/27/2015 1024   CALCIUM 9.5 02/07/2024 0937    PROT 7.2 02/07/2024 0937   ALBUMIN 4.4 02/07/2024 0937   AST 22 02/07/2024 0937   ALT 26 02/07/2024 0937   ALKPHOS 74 02/07/2024 0937   BILITOT 0.6 02/07/2024 0937      Latest Ref Rng & Units 02/07/2024    9:37 AM 09/27/2015   10:24 AM  Hepatic Function  Total Protein 6.0 - 8.3 g/dL 7.2  6.7   Albumin 3.5 - 5.2 g/dL 4.4  4.4   AST 0 - 37 U/L 22  20   ALT 0 - 53 U/L 26  20   Alk Phosphatase 39 - 117 U/L 74  69   Total Bilirubin 0.2 - 1.2 mg/dL 0.6  0.6       Current Medications:   Current Outpatient Medications (Endocrine & Metabolic):    levothyroxine (SYNTHROID, LEVOTHROID) 125 MCG tablet, Monday - Saturday daily.  Do not take on Sunday  Current Outpatient Medications (Cardiovascular):    tadalafil (CIALIS) 5 MG tablet, Take 5 mg by mouth daily as needed.   rosuvastatin (CRESTOR) 5 MG tablet, Take 10 mg by mouth daily.   Current Outpatient Medications (Analgesics):    meloxicam  (MOBIC ) 15 MG tablet, Take 15 mg by mouth daily as needed.  Current Outpatient Medications (Hematological):  ferrous sulfate  325 (65 FE) MG EC tablet, Take 1 tablet (325 mg total) by mouth 3 (three) times daily with meals. (Patient taking differently: Take 325 mg by mouth daily.)  Current Outpatient Medications (Other):    B Complex Vitamins (VITAMIN B COMPLEX PO), Take by mouth. Super B 100   BINAXNOW COVID-19 AG HOME TEST KIT, Use as Directed on the Package   Cholecalciferol (VITAMIN D3 PO), Take by mouth.   cyclobenzaprine  (FLEXERIL ) 10 MG tablet, Take 10 mg by mouth at bedtime as needed.   diclofenac  Sodium (VOLTAREN ) 1 % GEL, Apply 4 g topically 4 (four) times daily. (Patient taking differently: Apply 4 g topically 4 (four) times daily. prn)   Ginger 500 MG CAPS, Take 1 capsule by mouth daily.   Na Sulfate-K Sulfate-Mg Sulfate concentrate (SUPREP) 17.5-3.13-1.6 GM/177ML SOLN, Take 1 kit (354 mLs total) by mouth once for 1 dose.   Probiotic Product (PROBIOTIC ADVANCED PO), Take 1 capsule by  mouth daily.   PSYLLIUM HUSK PO, Take 5 capsules by mouth daily.   TURMERIC PO, Take by mouth.  Medical History:  Past Medical History:  Diagnosis Date   Anemia    low fertin   Cancer [facility])    Thyroid  Cancer Jan 2016   Kidney stones    Plantar fasciitis    bilateral   Thyroid  disease    Thyroid  Cancer   Allergies:  Allergies  Allergen Reactions   Neomycin Hives   Keflex [Cephalexin] Rash     Surgical History:  He  has a past surgical history that includes Hernia repair; Colonoscopy; Lithotripsy; and Cystoscopy with insertion of urolift (01/2023). Family History:  His family history includes Alzheimer's disease in his mother; Breast cancer in his mother; Colon polyps in his mother; Diabetes in his mother; Heart attack in his father; Heart disease in his father; High blood pressure in his father and mother; Obesity in his father and mother.  REVIEW OF SYSTEMS  : All other systems reviewed and negative except where noted in the History of Present Illness.  PHYSICAL EXAM: BP 128/70   Pulse 63   Ht 5' 10 (1.778 m)   Wt 171 lb (77.6 kg)   SpO2 99%   BMI 24.54 kg/m  Physical Exam   GENERAL APPEARANCE: Well nourished, in no apparent distress. HEENT: No cervical lymphadenopathy, unremarkable thyroid , sclerae anicteric, conjunctiva pink. RESPIRATORY: Respiratory effort normal, breath sounds equal bilaterally without rales, rhonchi, or wheezing. CARDIO: Regular rate and rhythm with no murmurs, rubs, or gallops, peripheral pulses intact. ABDOMEN: Soft, non-distended, active bowel sounds in all four quadrants, non-tender to palpation, no rebound, no mass appreciated. RECTAL: Declines. MUSCULOSKELETAL: Full range of motion, normal gait, without edema. SKIN: Dry, intact without rashes or lesions. No jaundice. NEURO: Alert, oriented, no focal deficits. PSYCH: Cooperative, normal mood and affect.      Alan JONELLE Coombs, PA-C 11:16 AM

## 2024-02-07 ENCOUNTER — Ambulatory Visit: Payer: Self-pay | Admitting: Physician Assistant

## 2024-02-07 ENCOUNTER — Ambulatory Visit (INDEPENDENT_AMBULATORY_CARE_PROVIDER_SITE_OTHER): Admitting: Physician Assistant

## 2024-02-07 ENCOUNTER — Other Ambulatory Visit (INDEPENDENT_AMBULATORY_CARE_PROVIDER_SITE_OTHER)

## 2024-02-07 ENCOUNTER — Encounter: Payer: Self-pay | Admitting: Physician Assistant

## 2024-02-07 VITALS — BP 128/70 | HR 63 | Ht 70.0 in | Wt 171.0 lb

## 2024-02-07 DIAGNOSIS — K529 Noninfective gastroenteritis and colitis, unspecified: Secondary | ICD-10-CM | POA: Diagnosis not present

## 2024-02-07 DIAGNOSIS — D509 Iron deficiency anemia, unspecified: Secondary | ICD-10-CM | POA: Diagnosis not present

## 2024-02-07 LAB — COMPREHENSIVE METABOLIC PANEL WITH GFR
ALT: 26 U/L (ref 0–53)
AST: 22 U/L (ref 0–37)
Albumin: 4.4 g/dL (ref 3.5–5.2)
Alkaline Phosphatase: 74 U/L (ref 39–117)
BUN: 13 mg/dL (ref 6–23)
CO2: 27 meq/L (ref 19–32)
Calcium: 9.5 mg/dL (ref 8.4–10.5)
Chloride: 103 meq/L (ref 96–112)
Creatinine, Ser: 0.95 mg/dL (ref 0.40–1.50)
GFR: 85.37 mL/min (ref >60.00)
Glucose, Bld: 95 mg/dL (ref 70–99)
Potassium: 4 meq/L (ref 3.5–5.1)
Sodium: 139 meq/L (ref 135–145)
Total Bilirubin: 0.6 mg/dL (ref 0.2–1.2)
Total Protein: 7.2 g/dL (ref 6.0–8.3)

## 2024-02-07 LAB — C-REACTIVE PROTEIN: CRP: <1 mg/dL (ref 0.5–20.0)

## 2024-02-07 LAB — SEDIMENTATION RATE: Sed Rate: 5 mm/h (ref 0–20)

## 2024-02-07 LAB — CBC WITH DIFFERENTIAL/PLATELET
Basophils Absolute: 0 K/uL (ref 0.0–0.1)
Basophils Relative: 0.8 % (ref 0.0–3.0)
Eosinophils Absolute: 0.1 K/uL (ref 0.0–0.7)
Eosinophils Relative: 1.5 % (ref 0.0–5.0)
HCT: 44.8 % (ref 39.0–52.0)
Hemoglobin: 14.9 g/dL (ref 13.0–17.0)
Lymphocytes Relative: 26.3 % (ref 12.0–46.0)
Lymphs Abs: 1.4 K/uL (ref 0.7–4.0)
MCHC: 33.2 g/dL (ref 30.0–36.0)
MCV: 87.2 fl (ref 78.0–100.0)
Monocytes Absolute: 0.5 K/uL (ref 0.1–1.0)
Monocytes Relative: 10.1 % (ref 3.0–12.0)
Neutro Abs: 3.3 K/uL (ref 1.4–7.7)
Neutrophils Relative %: 61.3 % (ref 43.0–77.0)
Platelets: 320 K/uL (ref 150.0–400.0)
RBC: 5.14 Mil/uL (ref 4.22–5.81)
RDW: 13.1 % (ref 11.5–15.5)
WBC: 5.4 K/uL (ref 4.0–10.5)

## 2024-02-07 MED ORDER — NA SULFATE-K SULFATE-MG SULF 17.5-3.13-1.6 GM/177ML PO SOLN
1.0000 | Freq: Once | ORAL | 0 refills | Status: AC
Start: 2024-02-07 — End: 2024-02-07

## 2024-02-07 NOTE — Patient Instructions (Addendum)
 Your provider has requested that you go to the basement level for lab work before leaving today. Press B on the elevator. The lab is located at the first door on the left as you exit the elevator.  - Can try loperamide 4 mg initially, then 2 mg after each unformed stool for =2 days, with a maximum of 16 mg/day.  - If loperamide is not working, you could try bismuth salicylate (Pepto-Bismol) 30 mL or two tablets every 30 minutes for eight doses. Pepto-Bismol may make your stools black.   Go to the ER if any severe abdominal pain, fever, or weakness    FODMAP stands for fermentable oligo-, di-, mono-saccharides and polyols (1). These are the scientific terms used to classify groups of carbs that are difficult for our body to digest and that are notorious for triggering digestive symptoms like bloating, gas, loose stools and stomach pain.   You can try low FODMAP diet  - start with eliminating just one column at a time that you feel may be a trigger for you. - the table at the very bottom contains foods that are low in FODMAPs   Sometimes trying to eliminate the FODMAP's from your diet is difficult or tricky, if you are stuggling with trying to do the elimination diet you can try an enzyme.  There is a food enzymes that you sprinkle in or on your food that helps break down the FODMAP. You can read more about the enzyme by going to this site: https://fodzyme.com/    What Is Microscopic Colitis? Microscopic colitis is a condition that causes chronic, watery diarrhea. Unlike other types of colitis (like ulcerative colitis or Crohn's disease), the colon (large intestine) appears normal during a colonoscopy. The inflammation can only be seen under a microscope--hence the name.  There are two main types:  Lymphocytic colitis - increased white blood cells (lymphocytes) in the colon lining Collagenous colitis - thickened layer of collagen (a protein) in the colon lining  Common  Symptoms  Ongoing watery diarrhea (often multiple times a day) Urgency to have a bowel movement Abdominal pain or cramping Bloating or gas Fatigue Weight loss (less common)  Causes and Risk Factors The exact cause isn't fully known, but possible contributing factors include:  Immune system reactions  Medications, such as: NSAIDs (e.g., ibuprofen) Proton pump inhibitors (e.g., omeprazole) SSRIs (e.g., sertraline) Smoking Older age (most common in people over 60) Male sex (more common in women)   How Is It Diagnosed?  Colonoscopy: usually appears normal  Biopsy (tiny tissue sample from the colon) is needed to see inflammation under a microscope  Treatment Options Treatment depends on how severe your symptoms are:  Lifestyle & Diet Changes  Avoid trigger foods (e.g., caffeine, dairy, fatty foods) Quit smoking Reduce alcohol Stay hydrated  Medications  Anti-diarrheal meds (like loperamide/Imodium) Budesonide (a corticosteroid with fewer side effects, often the first choice for moderate-to-severe cases) Bismuth subsalicylate (e.g., Pepto-Bismol) Stopping or switching medications that may be triggering the condition   What's the Outlook?  Many people improve with treatment. The condition may come and go. It's not associated with colon cancer. Regular follow-up helps keep symptoms under control.

## 2024-02-08 LAB — IGA: Immunoglobulin A: 397 mg/dL — ABNORMAL HIGH (ref 70–320)

## 2024-02-08 LAB — TISSUE TRANSGLUTAMINASE, IGA: (tTG) Ab, IgA: <1 U/mL

## 2024-02-11 DIAGNOSIS — Z125 Encounter for screening for malignant neoplasm of prostate: Secondary | ICD-10-CM | POA: Diagnosis not present

## 2024-02-11 DIAGNOSIS — N138 Other obstructive and reflux uropathy: Secondary | ICD-10-CM | POA: Diagnosis not present

## 2024-02-11 DIAGNOSIS — N401 Enlarged prostate with lower urinary tract symptoms: Secondary | ICD-10-CM | POA: Diagnosis not present

## 2024-02-23 DIAGNOSIS — Z23 Encounter for immunization: Secondary | ICD-10-CM | POA: Diagnosis not present

## 2024-03-13 ENCOUNTER — Encounter: Payer: Self-pay | Admitting: Internal Medicine

## 2024-03-20 ENCOUNTER — Ambulatory Visit: Admitting: Internal Medicine

## 2024-03-20 ENCOUNTER — Encounter: Payer: Self-pay | Admitting: Internal Medicine

## 2024-03-20 VITALS — BP 103/78 | HR 50 | Temp 98.0°F | Resp 13 | Ht 70.0 in | Wt 171.0 lb

## 2024-03-20 DIAGNOSIS — K5730 Diverticulosis of large intestine without perforation or abscess without bleeding: Secondary | ICD-10-CM

## 2024-03-20 DIAGNOSIS — K6389 Other specified diseases of intestine: Secondary | ICD-10-CM | POA: Diagnosis not present

## 2024-03-20 DIAGNOSIS — K529 Noninfective gastroenteritis and colitis, unspecified: Secondary | ICD-10-CM

## 2024-03-20 DIAGNOSIS — K2100 Gastro-esophageal reflux disease with esophagitis, without bleeding: Secondary | ICD-10-CM

## 2024-03-20 DIAGNOSIS — K3189 Other diseases of stomach and duodenum: Secondary | ICD-10-CM | POA: Diagnosis not present

## 2024-03-20 DIAGNOSIS — K648 Other hemorrhoids: Secondary | ICD-10-CM

## 2024-03-20 DIAGNOSIS — D509 Iron deficiency anemia, unspecified: Secondary | ICD-10-CM | POA: Diagnosis not present

## 2024-03-20 MED ORDER — SODIUM CHLORIDE 0.9 % IV SOLN: 500.0000 mL | Freq: Once | INTRAVENOUS | Status: DC

## 2024-03-20 NOTE — Op Note (Signed)
 [facility] Patient Name: Benjamin Zimmerman Procedure Date: 03/20/2024 2:05 PM MRN: [id number] Endoscopist: Gordy CHRISTELLA Starch , MD, [id number] Age: 63 Referring MD:  Date of Birth: 03-24-1961 Gender: Male Account #: [id number] Procedure:                Colonoscopy Indications:              Chronic diarrhea, Iron deficiency anemia Medicines:                Monitored Anesthesia Care Procedure:                Pre-Anesthesia Assessment:                           - Prior to the procedure, a History and Physical                            was performed, and patient medications and                            allergies were reviewed. The patient's tolerance of                            previous anesthesia was also reviewed. The risks                            and benefits of the procedure and the sedation                            options and risks were discussed with the patient.                            All questions were answered, and informed consent                            was obtained. Prior Anticoagulants: The patient has                            taken no anticoagulant or antiplatelet agents. ASA                            Grade Assessment: II - A patient with mild systemic                            disease. After reviewing the risks and benefits,                            the patient was deemed in satisfactory condition to                            undergo the procedure.                           After obtaining informed consent, the colonoscope  was passed under direct vision. Throughout the                            procedure, the patient's blood pressure, pulse, and                            oxygen saturations were monitored continuously. The                            Olympus Scope [id number] was introduced through the                            anus and advanced to the terminal ileum. The                            colonoscopy was  performed without difficulty. The                            patient tolerated the procedure well. The quality                            of the bowel preparation was good. The terminal                            ileum, ileocecal valve, appendiceal orifice, and                            rectum were photographed. Scope In: 2:25:16 PM Scope Out: 2:41:51 PM Scope Withdrawal Time: 0 hours 13 minutes 50 seconds  Total Procedure Duration: 0 hours 16 minutes 35 seconds  Findings:                 The digital rectal exam was normal.                           The terminal ileum appeared normal.                           Multiple small-mouthed diverticula were found in                            the sigmoid colon.                           Internal hemorrhoids were found during                            retroflexion. The hemorrhoids were small.                           Biopsies for histology were taken with a cold                            forceps from the right colon and left colon for  evaluation of microscopic colitis. Complications:            No immediate complications. Estimated Blood Loss:     Estimated blood loss: none. Impression:               - The examined portion of the ileum was normal.                           - Mild diverticulosis in the sigmoid colon.                           - Small internal hemorrhoids.                           - Biopsies were taken with a cold forceps from the                            right colon and left colon for evaluation of                            microscopic colitis. Recommendation:           - Patient has a contact number available for                            emergencies. The signs and symptoms of potential                            delayed complications were discussed with the                            patient. Return to normal activities tomorrow.                            Written discharge instructions were  provided to the                            patient.                           - Resume previous diet.                           - Continue present medications.                           - Await pathology results.                           - Patient has a contact number available for                            emergencies. The signs and symptoms of potential                            delayed complications were discussed with the  patient. Return to normal activities tomorrow.                            Written discharge instructions were provided to the                            patient.                           - Resume previous diet.                           - Continue present medications.                           - Await pathology results.                           - Repeat colonoscopy in 10 years for screening                            purposes. Gordy CHRISTELLA Starch, MD 03/20/2024 2:54:30 PM This report has been signed electronically.

## 2024-03-20 NOTE — Progress Notes (Signed)
1407 Robinul 0.1 mg IV given due large amount of secretions upon assessment.  MD made aware, vss  ?

## 2024-03-20 NOTE — Patient Instructions (Addendum)
 Continue present medications. Await pathology results. Repeat colonoscopy in 10 years for screening purposes.  YOU HAD AN ENDOSCOPIC PROCEDURE TODAY AT THE [facility]:   Refer to the procedure report that was given to you for any specific questions about what was found during the examination.  If the procedure report does not answer your questions, please call your gastroenterologist to clarify.  If you requested that your care partner not be given the details of your procedure findings, then the procedure report has been included in a sealed envelope for you to review at your convenience later.  YOU SHOULD EXPECT: Some feelings of bloating in the abdomen. Passage of more gas than usual.  Walking can help get rid of the air that was put into your GI tract during the procedure and reduce the bloating. If you had a lower endoscopy (such as a colonoscopy or flexible sigmoidoscopy) you may notice spotting of blood in your stool or on the toilet paper. If you underwent a bowel prep for your procedure, you may not have a normal bowel movement for a few days.  Please Note:  You might notice some irritation and congestion in your nose or some drainage.  This is from the oxygen used during your procedure.  There is no need for concern and it should clear up in a day or so.  SYMPTOMS TO REPORT IMMEDIATELY:  Following lower endoscopy (colonoscopy or flexible sigmoidoscopy):  Excessive amounts of blood in the stool  Significant tenderness or worsening of abdominal pains  Swelling of the abdomen that is new, acute  Fever of 100F or higher  Following upper endoscopy (EGD)  Vomiting of blood or coffee ground material  New chest pain or pain under the shoulder blades  Painful or persistently difficult swallowing  New shortness of breath  Fever of 100F or higher  Black, tarry-looking stools  For urgent or emergent issues, a gastroenterologist can be reached at any hour by calling [phone]  [phone]. Do not use MyChart messaging for urgent concerns.    DIET:  We do recommend a small meal at first, but then you may proceed to your regular diet.  Drink plenty of fluids but you should avoid alcoholic beverages for 24 hours.  ACTIVITY:  You should plan to take it easy for the rest of today and you should NOT DRIVE or use heavy machinery until tomorrow (because of the sedation medicines used during the test).    FOLLOW UP: Our staff will call the number listed on your records the next business day following your procedure.  We will call around 7:15- 8:00 am to check on you and address any questions or concerns that you may have regarding the information given to you following your procedure. If we do not reach you, we will leave a message.     If any biopsies were taken you will be contacted by phone or by letter within the next 1-3 weeks.  Please call us  at [phone] if you have not heard about the biopsies in 3 weeks.    SIGNATURES/CONFIDENTIALITY: You and/or your care partner have signed paperwork which will be entered into your electronic medical record.  These signatures attest to the fact that that the information above on your After Visit Summary has been reviewed and is understood.  Full responsibility of the confidentiality of this discharge information lies with you and/or your care-partner.

## 2024-03-20 NOTE — Op Note (Signed)
 [facility] Patient Name: Benjamin Zimmerman Procedure Date: 03/20/2024 2:06 PM MRN: [id number] Endoscopist: Gordy CHRISTELLA Starch , MD, [id number] Age: 63 Referring MD:  Date of Birth: 04-11-1961 Gender: Male Account #: [id number] Procedure:                Upper GI endoscopy Indications:              Iron deficiency anemia Medicines:                Monitored Anesthesia Care Procedure:                Pre-Anesthesia Assessment:                           - Prior to the procedure, a History and Physical                            was performed, and patient medications and                            allergies were reviewed. The patient's tolerance of                            previous anesthesia was also reviewed. The risks                            and benefits of the procedure and the sedation                            options and risks were discussed with the patient.                            All questions were answered, and informed consent                            was obtained. Prior Anticoagulants: The patient has                            taken no anticoagulant or antiplatelet agents. ASA                            Grade Assessment: II - A patient with mild systemic                            disease. After reviewing the risks and benefits,                            the patient was deemed in satisfactory condition to                            undergo the procedure.                           After obtaining informed consent, the endoscope was  passed under direct vision. Throughout the                            procedure, the patient's blood pressure, pulse, and                            oxygen saturations were monitored continuously. The                            Olympus scope [id number] was introduced through the                            mouth, and advanced to the second part of duodenum.                            The upper GI endoscopy was  accomplished without                            difficulty. The patient tolerated the procedure                            well. Scope In: Scope Out: Findings:                 LA Grade A (one or more mucosal breaks less than 5                            mm, not extending between tops of 2 mucosal folds)                            esophagitis with no bleeding was found at the                            gastroesophageal junction.                           The exam of the esophagus was otherwise normal.                           The entire examined stomach was normal. Biopsies                            were taken with a cold forceps for histology and                            Helicobacter pylori testing.                           The examined duodenum was normal. Complications:            No immediate complications. Estimated Blood Loss:     Estimated blood loss: none. Impression:               - LA Grade A reflux esophagitis with no bleeding.                           -  Normal stomach. Biopsied.                           - Normal examined duodenum. Recommendation:           - Patient has a contact number available for                            emergencies. The signs and symptoms of potential                            delayed complications were discussed with the                            patient. Return to normal activities tomorrow.                            Written discharge instructions were provided to the                            patient.                           - Resume previous diet.                           - Continue present medications.                           - Await pathology results.                           - See the other procedure note for documentation of                            additional recommendations. Gordy CHRISTELLA Starch, MD 03/20/2024 2:48:53 PM This report has been signed electronically.

## 2024-03-20 NOTE — Progress Notes (Signed)
 Report given to PACU, vss

## 2024-03-20 NOTE — Progress Notes (Signed)
 GASTROENTEROLOGY PROCEDURE H&P NOTE   Primary Care Physician: Cyrena Gwenn SQUIBB, MD (Inactive)    Reason for Procedure:  Iron deficiency anemia and chronic diarrhea  Plan:    EGD and colonoscopy  Patient is appropriate for endoscopic procedure(s) in the ambulatory ([facility]) setting.  The nature of the procedure, as well as the risks, benefits, and alternatives were carefully and thoroughly reviewed with the patient. Ample time for discussion and questions allowed. The patient understood, was satisfied, and agreed to proceed.     HPI: Benjamin Zimmerman is a 63 y.o. male who presents for EGD and colonoscopy.  Medical history as below.  Tolerated the prep.  No recent chest pain or shortness of breath.  No abdominal pain today.  Past Medical History:  Diagnosis Date   Anemia    low fertin   Cancer [facility])    Thyroid  Cancer Jan 2016   Kidney stones    Plantar fasciitis    bilateral   Thyroid  disease    Thyroid  Cancer    Past Surgical History:  Procedure Laterality Date   COLONOSCOPY     CYSTOSCOPY WITH INSERTION OF UROLIFT  01/2023   HERNIA REPAIR     as a child    LITHOTRIPSY      Prior to Admission medications   Medication Sig Start Date End Date Taking? Authorizing Provider  B Complex Vitamins (VITAMIN B COMPLEX PO) Take by mouth. Super B 100   Yes [provider]  Cholecalciferol (VITAMIN D3 PO) Take by mouth.   Yes [provider]  Ginger 500 MG CAPS Take 1 capsule by mouth daily.   Yes [provider]  levothyroxine (SYNTHROID, LEVOTHROID) 125 MCG tablet Monday - Saturday daily.  Do not take on Sunday 09/19/15  Yes [provider]  Probiotic Product (PROBIOTIC ADVANCED PO) Take 1 capsule by mouth daily.   Yes [provider]  rosuvastatin (CRESTOR) 5 MG tablet Take 10 mg by mouth daily.   Yes [provider]  TURMERIC PO Take by mouth.   Yes [provider]  ARLEENE PLENTY AG HOME TEST KIT Use as Directed  on the Package 02/09/21   [provider]  cyclobenzaprine  (FLEXERIL ) 10 MG tablet Take 10 mg by mouth at bedtime as needed. 12/02/20   [provider]  diclofenac  Sodium (VOLTAREN ) 1 % GEL Apply 4 g topically 4 (four) times daily. Patient taking differently: Apply 4 g topically 4 (four) times daily. prn 03/05/21   Brooks, Dana, DO  ferrous sulfate  325 (65 FE) MG EC tablet Take 1 tablet (325 mg total) by mouth 3 (three) times daily with meals. Patient taking differently: Take 325 mg by mouth daily. 01/04/18   Harvey Seltzer, MD  meloxicam  (MOBIC ) 15 MG tablet Take 15 mg by mouth daily as needed. 07/03/22   [provider]  PSYLLIUM HUSK PO Take 5 capsules by mouth daily.    [provider]  tadalafil (CIALIS) 5 MG tablet Take 5 mg by mouth daily as needed. 07/22/22   [provider]    Current Outpatient Medications  Medication Sig Dispense Refill   B Complex Vitamins (VITAMIN B COMPLEX PO) Take by mouth. Super B 100     Cholecalciferol (VITAMIN D3 PO) Take by mouth.     Ginger 500 MG CAPS Take 1 capsule by mouth daily.     levothyroxine (SYNTHROID, LEVOTHROID) 125 MCG tablet Monday - Saturday daily.  Do not take on Sunday  Probiotic Product (PROBIOTIC ADVANCED PO) Take 1 capsule by mouth daily.     rosuvastatin (CRESTOR) 5 MG tablet Take 10 mg by mouth daily.     TURMERIC PO Take by mouth.     BINAXNOW COVID-19 AG HOME TEST KIT Use as Directed on the Package     cyclobenzaprine  (FLEXERIL ) 10 MG tablet Take 10 mg by mouth at bedtime as needed.     diclofenac  Sodium (VOLTAREN ) 1 % GEL Apply 4 g topically 4 (four) times daily. (Patient taking differently: Apply 4 g topically 4 (four) times daily. prn) 4 g 2   ferrous sulfate  325 (65 FE) MG EC tablet Take 1 tablet (325 mg total) by mouth 3 (three) times daily with meals. (Patient taking differently: Take 325 mg by mouth daily.) 90 tablet 1   meloxicam  (MOBIC ) 15 MG tablet Take 15 mg by mouth daily as  needed.     PSYLLIUM HUSK PO Take 5 capsules by mouth daily.     tadalafil (CIALIS) 5 MG tablet Take 5 mg by mouth daily as needed.     Current Facility-Administered Medications  Medication Dose Route Frequency Provider Last Rate Last Admin   0.9 %  sodium chloride  infusion  500 mL Intravenous Once Marilee Ditommaso, Gordy HERO, MD        Allergies as of 03/20/2024 - Review Complete 03/20/2024  Allergen Reaction Noted   Neomycin Hives 03/04/2014   Keflex [cephalexin] Rash 05/16/2012    Family History  Problem Relation Age of Onset   Colon polyps Mother    Diabetes Mother    High blood pressure Mother    Obesity Mother    Alzheimer's disease Mother    Breast cancer Mother    Obesity Father    High blood pressure Father    Heart disease Father    Heart attack Father    Neuropathy Neg Hx    Colon cancer Neg Hx    Esophageal cancer Neg Hx    Rectal cancer Neg Hx    Stomach cancer Neg Hx     Social History   Socioeconomic History   Marital status: Married    Spouse name: Not on file   Number of children: 2   Years of education: Not on file   Highest education level: Master's degree (e.g., MA, MS, MEng, MEd, MSW, MBA)  Occupational History   Not on file  Tobacco Use   Smoking status: Never   Smokeless tobacco: Never  Vaping Use   Vaping status: Never Used  Substance and Sexual Activity   Alcohol use: Yes    Alcohol/week: 6.0 standard drinks of alcohol    Types: 6 Cans of beer per week    Comment: a few beers a week, usually on weekends   Drug use: Never   Sexual activity: Not on file  Other Topics Concern   Not on file  Social History Narrative   Lives at home with his wife   Right handed   Social Drivers of Health   Financial Resource Strain: Low Risk  (02/01/2024)   Received from [facility]   Overall Financial Resource Strain (CARDIA)    Difficulty of Paying Living Expenses: Not hard at all  Food Insecurity: No Food Insecurity (02/01/2024)   Received  from [facility]   Hunger Vital Sign    Within the past 12 months, you worried that your food would run out before you got the money to buy more.: Never true  Within the past 12 months, the food you bought just didn't last and you didn't have money to get more.: Never true  Transportation Needs: No Transportation Needs (02/01/2024)   Received from [facility] - Transportation    In the past 12 months, has lack of transportation kept you from medical appointments or from getting medications?: No    Lack of Transportation (Non-Medical): No  Physical Activity: Sufficiently Active (11/15/2023)   Received from [facility]   Exercise Vital Sign    On average, how many days per week do you engage in moderate to strenuous exercise (like a brisk walk)?: 6 days    On average, how many minutes do you engage in exercise at this level?: 70 min  Stress: No Stress Concern Present (11/15/2023)   Received from [facility] - Occupational Stress Questionnaire    Feeling of Stress : Only a little  Social Connections: Moderately Integrated (11/15/2023)   Received from [facility]   Social Network    How would you rate your social network (family, work, friends)?: Adequate participation with social networks  Intimate Partner Violence: Not At Risk (11/15/2023)   Received from [facility]   HITS    Over the last 12 months how often did your partner physically hurt you?: Never    Over the last 12 months how often did your partner insult you or talk down to you?: Never    Over the last 12 months how often did your partner threaten you with physical harm?: Never    Over the last 12 months how often did your partner scream or curse at you?: Never    Physical Exam: Vital signs in last 24 hours: @BP  114/71   Pulse 62   Temp 98 F (36.7 C)   Ht 5' 10 (1.778 m)   Wt 171 lb (77.6 kg)   SpO2 98%   BMI 24.54 kg/m   GEN: NAD EYE: Sclerae anicteric ENT: MMM CV: Non-tachycardic Pulm: CTA b/l GI: Soft, NT/ND NEURO:  Alert & Oriented x 3   Gordy Starch, MD [facility]  03/20/2024 2:06 PM

## 2024-03-20 NOTE — Progress Notes (Signed)
 Called to room to assist during endoscopic procedure.  Patient ID and intended procedure confirmed with present staff. Received instructions for my participation in the procedure from the performing physician.

## 2024-03-21 ENCOUNTER — Telehealth: Payer: Self-pay

## 2024-03-21 NOTE — Telephone Encounter (Signed)
 No answer on follow-up call. Left VM for pt.

## 2024-03-23 ENCOUNTER — Ambulatory Visit: Payer: Self-pay | Admitting: Internal Medicine

## 2024-03-23 LAB — SURGICAL PATHOLOGY

## 2024-03-24 ENCOUNTER — Other Ambulatory Visit: Payer: Self-pay

## 2024-03-24 MED ORDER — BUDESONIDE 3 MG PO CPEP: ORAL_CAPSULE | ORAL | 0 refills | Status: DC

## 2024-03-29 ENCOUNTER — Ambulatory Visit: Admitting: Gastroenterology

## 2024-03-29 DIAGNOSIS — N138 Other obstructive and reflux uropathy: Secondary | ICD-10-CM | POA: Diagnosis not present

## 2024-03-29 DIAGNOSIS — N401 Enlarged prostate with lower urinary tract symptoms: Secondary | ICD-10-CM | POA: Diagnosis not present

## 2024-05-01 DIAGNOSIS — Z01818 Encounter for other preprocedural examination: Secondary | ICD-10-CM | POA: Diagnosis not present

## 2024-05-08 DIAGNOSIS — Z881 Allergy status to other antibiotic agents status: Secondary | ICD-10-CM | POA: Diagnosis not present

## 2024-05-08 DIAGNOSIS — N401 Enlarged prostate with lower urinary tract symptoms: Secondary | ICD-10-CM | POA: Diagnosis not present

## 2024-05-08 DIAGNOSIS — E890 Postprocedural hypothyroidism: Secondary | ICD-10-CM | POA: Diagnosis not present

## 2024-05-08 DIAGNOSIS — N138 Other obstructive and reflux uropathy: Secondary | ICD-10-CM | POA: Diagnosis not present

## 2024-05-08 DIAGNOSIS — Z79899 Other long term (current) drug therapy: Secondary | ICD-10-CM | POA: Diagnosis not present

## 2024-05-09 DIAGNOSIS — E890 Postprocedural hypothyroidism: Secondary | ICD-10-CM | POA: Diagnosis not present

## 2024-05-09 DIAGNOSIS — N401 Enlarged prostate with lower urinary tract symptoms: Secondary | ICD-10-CM | POA: Diagnosis not present

## 2024-05-09 DIAGNOSIS — N138 Other obstructive and reflux uropathy: Secondary | ICD-10-CM | POA: Diagnosis not present

## 2024-05-09 DIAGNOSIS — Z79899 Other long term (current) drug therapy: Secondary | ICD-10-CM | POA: Diagnosis not present

## 2024-05-09 DIAGNOSIS — Z881 Allergy status to other antibiotic agents status: Secondary | ICD-10-CM | POA: Diagnosis not present

## 2024-05-16 ENCOUNTER — Ambulatory Visit: Admitting: Internal Medicine

## 2024-05-16 ENCOUNTER — Encounter: Payer: Self-pay | Admitting: Internal Medicine

## 2024-05-16 VITALS — BP 120/80 | HR 64 | Ht 70.0 in | Wt 174.2 lb

## 2024-05-16 DIAGNOSIS — K52832 Lymphocytic colitis: Secondary | ICD-10-CM

## 2024-05-16 NOTE — Patient Instructions (Signed)
 Continue budesonide  taper.   If your symptoms come back while you are tapering, contact our office.   Hold your statin for one month to see if this helps your symptoms.   Reach out to our office after you continue to have loose stools.   _______________________________________________________  If your blood pressure at your visit was 140/90 or greater, please contact your primary care physician to follow up on this.  _______________________________________________________  If you are age 63 or older, your body mass index should be between 23-30. Your Body mass index is 25 kg/m. If this is out of the aforementioned range listed, please consider follow up with your Primary Care Provider.  If you are age 58 or younger, your body mass index should be between 19-25. Your Body mass index is 25 kg/m. If this is out of the aformentioned range listed, please consider follow up with your Primary Care Provider.   ________________________________________________________  The [facility] providers would like to encourage you to use MYCHART to communicate with providers for non-urgent requests or questions.  Due to long hold times on the telephone, sending your provider a message by [facility] may be a faster and more efficient way to get a response.  Please allow 48 business hours for a response.  Please remember that this is for non-urgent requests.  _______________________________________________________  [facility] is using a team-based approach to care.  Your team is made up of your doctor and two to three APPS. Our APPS (Nurse Practitioners and Physician Assistants) work with your physician to ensure care continuity for you. They are fully qualified to address your health concerns and develop a treatment plan. They communicate directly with your gastroenterologist to care for you. Seeing the Advanced Practice Practitioners on your physician's team can help you by facilitating care more  promptly, often allowing for earlier appointments, access to diagnostic testing, procedures, and other specialty referrals.

## 2024-05-16 NOTE — Progress Notes (Signed)
 "  Subjective:    Patient ID: Benjamin Zimmerman, male    DOB: 1961/02/12, 64 y.o.   MRN: [id number]  HPI Benjamin Zimmerman is a 63 year old male with recent diagnosis of lymphocytic colitis who presents for follow-up of chronic diarrhea and response to budesonide  therapy.  Chronic diarrhea began in early June 2025, with episodes described as sometimes explosive, though this is less so lately.  The diarrhea occurs randomly, with one to two days per week of loose stools; intervening stools are formed but very soft. Incontinence is almost never present. No dietary triggers have been identified, and he has not felt ill otherwise. Increased gas is noted, which he feels may be related to medication and can make diarrhea more explosive.  Budesonide  (Entocort) was started approximately seven weeks ago at 9 mg daily for six weeks, recently tapered to 6 mg daily. No clear change in symptoms was noted with dose reduction, and he feels symptoms were starting to improve prior to budesonide  initiation. He is about halfway through an 18-week course and finds symptoms manageable, stating he could live with his current condition.  Upper endoscopy and colonoscopy on March 20, 2024, for evaluation of iron deficiency anemia and chronic diarrhea showed mild LA grade A esophagitis, normal upper GI tract, normal gastric biopsy, and duodenal mucosa with minimally increased lymphocytes. TTG antibody was less than one and IgA was slightly elevated at 397. Colonoscopy revealed normal mucosa, small mouth diverticula in the sigmoid, and small internal hemorrhoids. Random biopsies showed lymphoplasmocytosis. CBC on May 09, 2024 showed hemoglobin 13.7, MCV 89.7, white count 9.5, and platelet count 265. CRP and ESR were normal in September 2025.  He does not take medication for reflux and was unaware of reflux prior to endoscopy findings. Rosuvastatin (Crestor) has been taken for two to three years, currently at 10 mg daily, increased  from 5 mg about a year ago; diarrhea onset did not coincide with dose increase. Meloxicam  is taken infrequently for plantar fasciitis, and he prefers to avoid medications when possible.  Family history is notable for ulcerative colitis in his mother.   Review of Systems As per HPI, otherwise negative  Current Medications, Allergies, Past Medical History, Past Surgical History, Family History and Social History were reviewed in [employer] record.    Objective:   Physical Exam BP 120/80   Pulse 64   Ht 5' 10 (1.778 m)   Wt 174 lb 3.2 oz (79 kg)   BMI 25.00 kg/m  Gen: awake, alert, NAD HEENT: anicteric  Neuro: nonfocal  Labs TTG antibody: <1 IgA: 397 CRP (01/2024): Within normal limits ESR (01/2024): Within normal limits CBC (05/09/2024): Hemoglobin 13.7, MCV 89.7, WBC 9.5, Platelet count 265  Diagnostic EGD (03/20/2024): Mild LA grade A esophagitis; otherwise normal upper gastrointestinal tract Colonoscopy (03/20/2024): Normal mucosa; sigmoid diverticulosis; small internal hemorrhoids  Pathology Gastric biopsy (03/20/2024): Normal; negative for Helicobacter pylori Duodenal biopsy (03/20/2024): Minimally increased intraepithelial lymphocytes; absence of villous blunting Colonic biopsy (03/20/2024): Lymphoplasmacytic infiltrate suggestive but not diagnostic of lymphocytic colitis      Assessment & Plan:   Lymphocytic colitis Chronic, mild, well-managed with intermittent diarrhea. Budesonide  taper ongoing with unclear benefit; possible statin association.  He is not using NSAIDs regularly making this less likely culprit for microscopic colitis - Continue budesonide  taper to continue 6 mg daily for total of 6 weeks and then 3 mg daily for a total of 6 weeks, monitor symptoms during dose reduction. - Discontinue rosuvastatin  for one month to assess symptom association. - Notify via MyChart if symptoms worsen during taper or post-discontinuation. -  Initiate rifaximin for two weeks if symptoms persist post-budesonide . - Provided guidance on budesonide 's low risk and alternative therapies. - Discussed GoodRx for medication cost savings, encouraged contact for access issues.  30 minutes total spent today including patient facing time, coordination of care, reviewing medical history/procedures/pertinent radiology studies, and documentation of the encounter.  "

## 2024-06-08 ENCOUNTER — Encounter: Payer: Self-pay | Admitting: Internal Medicine

## 2024-06-08 NOTE — Telephone Encounter (Signed)
 Increase budesonide  back to 9 mg daily Have him check back with me in 1-2 weeks with an update He can try loperamide 2 mg TIDPRN if needed in the meantime as well

## 2024-06-23 ENCOUNTER — Encounter: Payer: Self-pay | Admitting: Internal Medicine

## 2024-06-27 ENCOUNTER — Other Ambulatory Visit: Payer: Self-pay

## 2024-06-27 MED ORDER — BUDESONIDE 3 MG PO CPEP: 3.0000 mg | ORAL_CAPSULE | Freq: Every day | ORAL | 2 refills | Status: DC

## 2024-06-27 MED ORDER — BUDESONIDE 3 MG PO CPEP: 9.0000 mg | ORAL_CAPSULE | Freq: Every day | ORAL | 0 refills | Status: AC
